# Patient Record
Sex: Male | Born: 1935 | Race: White | Hispanic: No | Marital: Married | Smoking: Never smoker
Health system: Southern US, Community
[De-identification: ages and names within clinical notes are randomized; demographics above are authoritative.]

## PROBLEM LIST (undated history)

## (undated) DIAGNOSIS — L409 Psoriasis, unspecified: Secondary | ICD-10-CM

## (undated) DIAGNOSIS — C61 Malignant neoplasm of prostate: Secondary | ICD-10-CM

## (undated) DIAGNOSIS — I10 Essential (primary) hypertension: Secondary | ICD-10-CM

## (undated) DIAGNOSIS — I4891 Unspecified atrial fibrillation: Secondary | ICD-10-CM

## (undated) DIAGNOSIS — I499 Cardiac arrhythmia, unspecified: Secondary | ICD-10-CM

## (undated) HISTORY — PX: HAND SURGERY: SHX662

---

## 1997-06-29 ENCOUNTER — Ambulatory Visit (HOSPITAL_COMMUNITY): Admission: RE | Admit: 1997-06-29 | Discharge: 1997-06-29 | Payer: Self-pay | Admitting: Cardiology

## 1997-06-29 ENCOUNTER — Other Ambulatory Visit: Admission: RE | Admit: 1997-06-29 | Discharge: 1997-06-29 | Payer: Self-pay | Admitting: Cardiology

## 2002-10-07 ENCOUNTER — Ambulatory Visit (HOSPITAL_COMMUNITY): Admission: RE | Admit: 2002-10-07 | Discharge: 2002-10-07 | Payer: Self-pay | Admitting: Cardiology

## 2002-10-07 ENCOUNTER — Encounter: Payer: Self-pay | Admitting: Cardiology

## 2007-08-31 ENCOUNTER — Ambulatory Visit (HOSPITAL_COMMUNITY): Admission: RE | Admit: 2007-08-31 | Discharge: 2007-08-31 | Payer: Self-pay | Admitting: Orthopedic Surgery

## 2007-09-21 ENCOUNTER — Ambulatory Visit (HOSPITAL_BASED_OUTPATIENT_CLINIC_OR_DEPARTMENT_OTHER): Admission: RE | Admit: 2007-09-21 | Discharge: 2007-09-21 | Payer: Self-pay | Admitting: Orthopedic Surgery

## 2007-09-21 ENCOUNTER — Encounter (INDEPENDENT_AMBULATORY_CARE_PROVIDER_SITE_OTHER): Payer: Self-pay | Admitting: Orthopedic Surgery

## 2007-12-15 ENCOUNTER — Ambulatory Visit (HOSPITAL_COMMUNITY): Admission: RE | Admit: 2007-12-15 | Discharge: 2007-12-15 | Payer: Self-pay | Admitting: Urology

## 2009-08-30 ENCOUNTER — Ambulatory Visit (HOSPITAL_COMMUNITY): Admission: RE | Admit: 2009-08-30 | Discharge: 2009-08-30 | Payer: Self-pay | Admitting: Urology

## 2010-02-03 ENCOUNTER — Encounter: Payer: Self-pay | Admitting: Urology

## 2010-05-28 NOTE — Op Note (Signed)
NAME:  Stephen Molina, Stephen Molina NO.:  [id number]   MEDICAL RECORD NO.:  [id number]          PATIENT TYPE:  AMB   LOCATION:  DSC                          FACILITY:  [facility]   PHYSICIAN:  Katy Fitch. Sypher, M.D. DATE OF BIRTH:  1916/03/29   DATE OF PROCEDURE:  09/21/2007  DATE OF DISCHARGE:                               OPERATIVE REPORT   PREOPERATIVE DIAGNOSES:  Complex Dupuytren contracture, right hand with  extensive disease involving pre-tendinous fibers and palm to ring and  small fingers and extensive palmar fascial involvement of lateral  fascial sheaths, spiral cords, and central cords of ring finger leading  to a 75-degree flexion contracture of the proximal interphalangeal joint  and a stiff boutonniere posture due to contraction of the spiral oblique  retinacular ligaments, right ring finger.  Also, thumb index webspace  and natatory ligament contracture.   POSTOPERATIVE DIAGNOSES:  Complex Dupuytren contracture, right hand with  extensive disease involving pre-tendinous fibers and palm to ring and  small fingers and extensive palmar fascial involvement of lateral  fascial sheaths, spiral cords, and central cords of ring finger leading  to a 75-degree flexion contracture of the proximal interphalangeal joint  and a stiff boutonniere posture due to contraction of the spiral oblique  retinacular ligaments, right ring finger.  Also, thumb index webspace  and natatory ligament contracture.   OPERATION:  1. Resection of Dupuytren contracture of right small finger including      palm, pre-tendinous fibers, spiral cords, retrovascular cords,      lateral fascial sheaths, and distal central cord.  Also, release of      spiral oblique retinacular ligament, right ring finger and proximal      interphalangeal capsular-ligamentous release for relief of stiff      boutonniere posture.  2. Resection of pre-tendinous fibers, small finger and palm.  3. Needle aponeurotomy of  natatory contracture and thumb index      webspace.   OPERATING SURGEON:  Katy Fitch. Sypher, MD   ASSISTANT:  Marveen Reeks Dasnoit, PA-C   ANESTHESIA:  General sedation/right infraclavicular block.   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   In 2001, he consulted with me and was noted to have signs of significant  palmar fascial contracture.   He was advised to return when his PIP flexion contracture exceeded 30  degrees or his MP flexion contracture exceeded 45 degrees.   Dr. Smitty Cords returned with an extensive PIP contracture with a stiff  boutonniere posture and a 75-degree flexion fracture of the PIP joint.  He had extensive involvement of the palm.  He also had a contracture of  his thumb index webspace.   We had a lengthy informed consent preoperatively.   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  He was noted to have mild hypertension.   After detailed informed consent during which  we discussed the entire  spectrum of treatment options for Dupuytren contracture, he ultimately  elected to proceed with conventional fasciectomy.   Preoperatively, we advised him that we would release the first web cord  with either limited fasciectomy or a needle aponeurotomy technique and  also discussed in detail the process of resecting the palmar fascia  followed by an aftercare plan with nighttime splinting and compression  dressings.   Preoperatively, questions were invited and answered in detail.   PROCEDURE:  Stephen Molina was brought to the operating room and placed  in supine position on the operating table.  Following anesthesia consult  with Dr. Noreene Larsson in the holding area, a right infraclavicular block was  placed without complication.  Excellent  anesthesia of the right upper  extremity was accomplished.   Dr. Smitty Cords was brought to room 6 of the [facility], placed in  supine position on the operating table, and under Dr. Morley Kos direct  supervision sedation provided.   The right arm was prepped with Betadine soap solution and sterilely  draped.  Ancef 1 gram was administered as an IV prophylactic antibiotic.   Due to mild systolic hypertension, the tourniquet was set at 260 mmHg.  Following exsanguination of the right arm with an Esmarch bandage, the  tourniquet was inflated on the proximal brachium.   Procedure commenced with planning of Brunner zigzag incisions with an  anticipated V-Y advancement flap technique to lengthen the skin of the  ring finger.   The skin flaps were taken sharply and elevated with scalpel and scissors  dissection.  Extensive infiltration of the palmar skin was noted with  particular involvement of nodular disease at the middle palmar crease  and at the level of the PIP flexion crease.  That was extreme involving  the lateral fascial sheets of the fingers.  A large spiral band on the  ulnar aspect of the ring finger and extensive involvement with a  retrovascular cord on the radial aspect of the finger.  The distal  disease at the middle phalanx was resected.   After completion of the dissection, the neurovascular bundles were  intact.  We will leave the PIP flexion fracture to approximately 25  degrees, however, the DIP joint could only flex 20 degrees due to  contracture of the spiral oblique retinacular ligament.  This was  identified and resected.  With gentle manipulation, the PIP joint was  extended to less than 5 degrees flexion contracture with minimal release  of the capsular-ligamentous structures.   With the PIP joint in maximal extension, I was able to flex the DIP  joint 70 degrees.   We will address the attenuation of the extensor mechanism with  splinting.   The  pre-tendinous fibers to the small finger were then resected to the  same wound in the palm, followed by needle aponeurotomy of the thumb  index webspace with 3 needle passes relieving the clinical contracture.   The tourniquet was released and hemostasis was achieved under saline  irrigation with bipolar forceps and direct pressure.  The V-Y flaps were  advanced and inset with multiple interrupted sutures of 5-0 nylon.   A voluminous dressings applied with Silvadene, Adaptic, sterile gauze,  sterile Kerlix, sterile Webril, and a volar plaster splint system.  There were no apparent complications.   Dr. Smitty Cords had immediate capillary refill upon release of the tourniquet  in all fingers and the thumb.      Katy Fitch Sypher, M.D.  Electronically Signed     RVS/MEDQ  D:  09/21/2007  T:  09/22/2007  Job:  [id number]

## 2014-12-03 ENCOUNTER — Encounter (HOSPITAL_COMMUNITY): Payer: Self-pay | Admitting: Emergency Medicine

## 2014-12-03 ENCOUNTER — Emergency Department (HOSPITAL_COMMUNITY): Payer: Medicare Other

## 2014-12-03 DIAGNOSIS — R000 Tachycardia, unspecified: Secondary | ICD-10-CM | POA: Diagnosis not present

## 2014-12-03 DIAGNOSIS — R402142 Coma scale, eyes open, spontaneous, at arrival to emergency department: Secondary | ICD-10-CM | POA: Diagnosis present

## 2014-12-03 DIAGNOSIS — I5021 Acute systolic (congestive) heart failure: Secondary | ICD-10-CM

## 2014-12-03 DIAGNOSIS — R402362 Coma scale, best motor response, obeys commands, at arrival to emergency department: Secondary | ICD-10-CM | POA: Diagnosis present

## 2014-12-03 DIAGNOSIS — I214 Non-ST elevation (NSTEMI) myocardial infarction: Secondary | ICD-10-CM | POA: Diagnosis present

## 2014-12-03 DIAGNOSIS — Z923 Personal history of irradiation: Secondary | ICD-10-CM

## 2014-12-03 DIAGNOSIS — Z515 Encounter for palliative care: Secondary | ICD-10-CM | POA: Diagnosis present

## 2014-12-03 DIAGNOSIS — I480 Paroxysmal atrial fibrillation: Secondary | ICD-10-CM | POA: Diagnosis present

## 2014-12-03 DIAGNOSIS — I4510 Unspecified right bundle-branch block: Secondary | ICD-10-CM | POA: Diagnosis present

## 2014-12-03 DIAGNOSIS — R402252 Coma scale, best verbal response, oriented, at arrival to emergency department: Secondary | ICD-10-CM | POA: Diagnosis present

## 2014-12-03 DIAGNOSIS — R0902 Hypoxemia: Secondary | ICD-10-CM | POA: Diagnosis present

## 2014-12-03 DIAGNOSIS — R451 Restlessness and agitation: Secondary | ICD-10-CM | POA: Diagnosis not present

## 2014-12-03 DIAGNOSIS — N179 Acute kidney failure, unspecified: Secondary | ICD-10-CM | POA: Diagnosis not present

## 2014-12-03 DIAGNOSIS — Z7982 Long term (current) use of aspirin: Secondary | ICD-10-CM

## 2014-12-03 DIAGNOSIS — R0600 Dyspnea, unspecified: Secondary | ICD-10-CM

## 2014-12-03 DIAGNOSIS — Z79899 Other long term (current) drug therapy: Secondary | ICD-10-CM

## 2014-12-03 DIAGNOSIS — I509 Heart failure, unspecified: Secondary | ICD-10-CM

## 2014-12-03 DIAGNOSIS — I10 Essential (primary) hypertension: Secondary | ICD-10-CM | POA: Diagnosis present

## 2014-12-03 DIAGNOSIS — I4891 Unspecified atrial fibrillation: Secondary | ICD-10-CM | POA: Diagnosis present

## 2014-12-03 DIAGNOSIS — Z8546 Personal history of malignant neoplasm of prostate: Secondary | ICD-10-CM

## 2014-12-03 DIAGNOSIS — Z66 Do not resuscitate: Secondary | ICD-10-CM | POA: Diagnosis present

## 2014-12-03 DIAGNOSIS — I255 Ischemic cardiomyopathy: Secondary | ICD-10-CM | POA: Diagnosis not present

## 2014-12-03 HISTORY — DX: Malignant neoplasm of prostate: C61

## 2014-12-03 HISTORY — DX: Cardiac arrhythmia, unspecified: I49.9

## 2014-12-03 HISTORY — DX: Psoriasis, unspecified: L40.9

## 2014-12-03 HISTORY — DX: Essential (primary) hypertension: I10

## 2014-12-03 HISTORY — DX: Unspecified atrial fibrillation: I48.91

## 2014-12-03 LAB — TROPONIN I: Troponin I: >65 ng/mL (ref <0.031)

## 2014-12-03 LAB — COMPREHENSIVE METABOLIC PANEL
ALBUMIN: 3.3 g/dL — AB (ref 3.5–5.0)
ALK PHOS: 81 U/L (ref 38–126)
ALT: 77 U/L — ABNORMAL HIGH (ref 17–63)
ANION GAP: 20 — AB (ref 5–15)
AST: 475 U/L — ABNORMAL HIGH (ref 15–41)
BILIRUBIN TOTAL: 0.9 mg/dL (ref 0.3–1.2)
BUN: 37 mg/dL — ABNORMAL HIGH (ref 6–20)
CALCIUM: 9.2 mg/dL (ref 8.9–10.3)
CO2: 18 mmol/L — AB (ref 22–32)
Chloride: 98 mmol/L — ABNORMAL LOW (ref 101–111)
Creatinine, Ser: 2.14 mg/dL — ABNORMAL HIGH (ref 0.61–1.24)
GFR calc non Af Amer: 24 mL/min — ABNORMAL LOW (ref >60)
GFR, EST AFRICAN AMERICAN: 28 mL/min — AB (ref >60)
GLUCOSE: 232 mg/dL — AB (ref 65–99)
POTASSIUM: 4.1 mmol/L (ref 3.5–5.1)
SODIUM: 136 mmol/L (ref 135–145)
TOTAL PROTEIN: 7 g/dL (ref 6.5–8.1)

## 2014-12-03 LAB — CBC WITH DIFFERENTIAL/PLATELET
BASOS ABS: 0 10*3/uL (ref 0.0–0.1)
BASOS PCT: 0 %
Eosinophils Absolute: 0 10*3/uL (ref 0.0–0.7)
Eosinophils Relative: 0 %
HEMATOCRIT: 53.5 % — AB (ref 39.0–52.0)
HEMOGLOBIN: 17.2 g/dL — AB (ref 13.0–17.0)
LYMPHS PCT: 7 %
Lymphs Abs: 1.1 10*3/uL (ref 0.7–4.0)
MCH: 31.3 pg (ref 26.0–34.0)
MCHC: 32.1 g/dL (ref 30.0–36.0)
MCV: 97.3 fL (ref 78.0–100.0)
Monocytes Absolute: 0.5 10*3/uL (ref 0.1–1.0)
Monocytes Relative: 3 %
NEUTROS ABS: 14.7 10*3/uL — AB (ref 1.7–7.7)
NEUTROS PCT: 90 %
Platelets: 219 10*3/uL (ref 150–400)
RBC: 5.5 MIL/uL (ref 4.22–5.81)
RDW: 14.6 % (ref 11.5–15.5)
WBC: 16.3 10*3/uL — AB (ref 4.0–10.5)

## 2014-12-03 LAB — MAGNESIUM: Magnesium: 2.4 mg/dL (ref 1.7–2.4)

## 2014-12-03 LAB — MRSA PCR SCREENING: MRSA by PCR: NEGATIVE

## 2014-12-03 LAB — PROTIME-INR
INR: 1.18 (ref 0.00–1.49)
Prothrombin Time: 15.2 seconds (ref 11.6–15.2)

## 2014-12-03 LAB — I-STAT ARTERIAL BLOOD GAS, ED
Acid-base deficit: 7 mmol/L — ABNORMAL HIGH (ref 0.0–2.0)
BICARBONATE: 15.4 meq/L — AB (ref 20.0–24.0)
O2 Saturation: 87 %
PCO2 ART: 25.1 mmHg — AB (ref 35.0–45.0)
PO2 ART: 49 mmHg — AB (ref 80.0–100.0)
TCO2: 16 mmol/L (ref 0–100)
pH, Arterial: 7.393 (ref 7.350–7.450)

## 2014-12-03 LAB — TSH: TSH: 1.965 u[IU]/mL (ref 0.350–4.500)

## 2014-12-03 LAB — HEPARIN LEVEL (UNFRACTIONATED): Heparin Unfractionated: 0.86 IU/mL — ABNORMAL HIGH (ref 0.30–0.70)

## 2014-12-03 LAB — BRAIN NATRIURETIC PEPTIDE: B Natriuretic Peptide: 4310.4 pg/mL — ABNORMAL HIGH (ref 0.0–100.0)

## 2014-12-03 MED ORDER — ALBUTEROL SULFATE (2.5 MG/3ML) 0.083% IN NEBU
5.0000 mg | INHALATION_SOLUTION | Freq: Once | RESPIRATORY_TRACT | Status: AC
  Administered 2014-12-03: 5 mg via RESPIRATORY_TRACT
  Filled 2014-12-03: qty 6

## 2014-12-03 MED ORDER — ONDANSETRON HCL 4 MG/2ML IJ SOLN: 4.0000 mg | Freq: Four times a day (QID) | INTRAMUSCULAR | Status: DC | PRN

## 2014-12-03 MED ORDER — SODIUM CHLORIDE 0.9 % IJ SOLN: 3.0000 mL | INTRAMUSCULAR | Status: DC | PRN

## 2014-12-03 MED ORDER — ZOLPIDEM TARTRATE 5 MG PO TABS: 5.0000 mg | ORAL_TABLET | Freq: Every evening | ORAL | Status: DC | PRN

## 2014-12-03 MED ORDER — HEPARIN (PORCINE) IN NACL 100-0.45 UNIT/ML-% IJ SOLN
800.0000 [IU]/h | INTRAMUSCULAR | Status: DC
  Administered 2014-12-03: 1000 [IU]/h via INTRAVENOUS
  Administered 2014-12-04: 850 [IU]/h via INTRAVENOUS
  Filled 2014-12-03 (×3): qty 250

## 2014-12-03 MED ORDER — ASPIRIN 81 MG PO CHEW: 324.0000 mg | CHEWABLE_TABLET | Freq: Once | ORAL | Status: DC

## 2014-12-03 MED ORDER — FUROSEMIDE 10 MG/ML IJ SOLN
40.0000 mg | Freq: Once | INTRAMUSCULAR | Status: AC
  Administered 2014-12-03: 80 mg via INTRAVENOUS
  Filled 2014-12-03: qty 4

## 2014-12-03 MED ORDER — ATORVASTATIN CALCIUM 80 MG PO TABS
80.0000 mg | ORAL_TABLET | Freq: Every day | ORAL | Status: DC
  Administered 2014-12-03: 80 mg via ORAL
  Filled 2014-12-03: qty 1

## 2014-12-03 MED ORDER — CETYLPYRIDINIUM CHLORIDE 0.05 % MT LIQD
7.0000 mL | Freq: Two times a day (BID) | OROMUCOSAL | Status: DC
  Administered 2014-12-03 – 2014-12-05 (×4): 7 mL via OROMUCOSAL

## 2014-12-03 MED ORDER — ASPIRIN EC 81 MG PO TBEC
81.0000 mg | DELAYED_RELEASE_TABLET | Freq: Every day | ORAL | Status: DC
  Filled 2014-12-03: qty 1

## 2014-12-03 MED ORDER — HALOPERIDOL LACTATE 5 MG/ML IJ SOLN
2.0000 mg | Freq: Once | INTRAMUSCULAR | Status: AC
  Administered 2014-12-03: 2 mg via INTRAVENOUS
  Filled 2014-12-03: qty 1

## 2014-12-03 MED ORDER — FUROSEMIDE 10 MG/ML IJ SOLN
80.0000 mg | Freq: Once | INTRAMUSCULAR | Status: AC
  Administered 2014-12-03: 80 mg via INTRAVENOUS
  Filled 2014-12-03: qty 8

## 2014-12-03 MED ORDER — SODIUM CHLORIDE 0.9 % IJ SOLN
3.0000 mL | Freq: Two times a day (BID) | INTRAMUSCULAR | Status: DC
  Administered 2014-12-03 – 2014-12-05 (×4): 3 mL via INTRAVENOUS

## 2014-12-03 MED ORDER — ASPIRIN 81 MG PO CHEW
324.0000 mg | CHEWABLE_TABLET | Freq: Once | ORAL | Status: AC
  Administered 2014-12-03: 324 mg via ORAL
  Filled 2014-12-03: qty 4

## 2014-12-03 MED ORDER — FUROSEMIDE 10 MG/ML IJ SOLN
40.0000 mg | Freq: Once | INTRAMUSCULAR | Status: AC
  Administered 2014-12-03: 40 mg via INTRAVENOUS
  Filled 2014-12-03: qty 4

## 2014-12-03 MED ORDER — HEPARIN BOLUS VIA INFUSION
4000.0000 [IU] | Freq: Once | INTRAVENOUS | Status: AC
  Administered 2014-12-03: 4000 [IU] via INTRAVENOUS
  Filled 2014-12-03: qty 4000

## 2014-12-03 MED ORDER — PANTOPRAZOLE SODIUM 40 MG PO TBEC
40.0000 mg | DELAYED_RELEASE_TABLET | Freq: Every day | ORAL | Status: DC
Start: 2014-12-03 — End: 2014-12-05
  Administered 2014-12-03: 40 mg via ORAL
  Filled 2014-12-03 (×2): qty 1

## 2014-12-03 MED ORDER — SODIUM CHLORIDE 0.9 % IV SOLN: 250.0000 mL | INTRAVENOUS | Status: DC | PRN

## 2014-12-03 MED ORDER — MILRINONE IN DEXTROSE 20 MG/100ML IV SOLN
0.1250 ug/kg/min | INTRAVENOUS | Status: DC
  Administered 2014-12-03 – 2014-12-04 (×2): 0.125 ug/kg/min via INTRAVENOUS
  Filled 2014-12-03 (×2): qty 100

## 2014-12-03 MED ORDER — FUROSEMIDE 10 MG/ML IJ SOLN
INTRAMUSCULAR | Status: AC
  Filled 2014-12-03: qty 4

## 2014-12-03 MED ORDER — FUROSEMIDE 10 MG/ML IJ SOLN
40.0000 mg | Freq: Two times a day (BID) | INTRAMUSCULAR | Status: DC
  Administered 2014-12-03 – 2014-12-06 (×6): 40 mg via INTRAVENOUS
  Filled 2014-12-03 (×7): qty 4

## 2014-12-03 MED ORDER — NITROGLYCERIN 0.4 MG SL SUBL: 0.4000 mg | SUBLINGUAL_TABLET | SUBLINGUAL | Status: DC | PRN

## 2014-12-03 MED ORDER — CARVEDILOL 3.125 MG PO TABS: 3.1250 mg | ORAL_TABLET | Freq: Two times a day (BID) | ORAL | Status: DC

## 2014-12-03 MED ORDER — ACETAMINOPHEN 325 MG PO TABS: 650.0000 mg | ORAL_TABLET | ORAL | Status: DC | PRN

## 2014-12-03 MED ORDER — ALPRAZOLAM 0.25 MG PO TABS
0.2500 mg | ORAL_TABLET | Freq: Two times a day (BID) | ORAL | Status: DC | PRN
  Administered 2014-12-03 – 2014-12-04 (×2): 0.25 mg via ORAL
  Filled 2014-12-03 (×2): qty 1

## 2014-12-03 MED ORDER — CLOPIDOGREL BISULFATE 75 MG PO TABS
75.0000 mg | ORAL_TABLET | Freq: Every day | ORAL | Status: DC
  Administered 2014-12-03: 75 mg via ORAL
  Filled 2014-12-03 (×2): qty 1

## 2014-12-03 NOTE — Progress Notes (Signed)
Utilization Review Completed.Stephen Molina T11/20/2016  

## 2014-12-03 NOTE — H&P (Addendum)
History and Physical   Patient ID: Stephen Molina MRN: [id number], DOB/AGE: 1916/02/24 81 y.o. Date of Encounter: 11/20/2014  Primary Physician: No primary care provider on file. Primary Cardiologist: New  Chief Complaint:  SOB  HPI: Stephen Molina is a 81 y.o. male with a history of mild HTN, no hx DM, HL, CAD.   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 He does not know any particular heart rates. Can tell he is in atrial fib now by pulse check. No clear how long he has been in afib this time.   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 He notes that rest breaks have been more frequent and he is going slower. Denies LE edema, PND. Orthopnea is not clear.   Last pm, he had acutely worse SOB, some cough, non-productive. As the SOB became worse his wife brought him to the ER. In the ER, he got a nebulizer and is on NRB. Respirations are still labored but his O2 sats have improved.  Never had SOB like this before. Never gets chest pain. No edema. Takes Lasix 20 mg qd for HTN.   Past Medical History  Diagnosis Date  . Prostate cancer ([place])     1980s, s/p seed implants  . Essential hypertension     mild  . Atrial fibrillation ([place])     off and on since 2014 or so  . Psoriasis     Surgical History:  Past Surgical History  Procedure Laterality Date  . Hand surgery       I have reviewed the patient's current medications. Prior to Admission medications   Medication Sig Start Date End Date Taking? Authorizing Provider  aspirin 81 MG tablet Take 81 mg by mouth daily.   Yes Historical Provider, MD  furosemide (LASIX) 20 MG tablet Take 20 mg by mouth daily.   Yes Historical Provider, MD   Scheduled Meds:   Allergies: No Known Allergies  Social History   Social History  . Marital Status: Married    Spouse Name: N/A  . Number of Children: N/A  . Years of Education: N/A    Occupational History  . Retired Film/video editor    Social History Main Topics  . Smoking status: Never Smoker   . Smokeless tobacco: Not on file  . Alcohol Use: No  . Drug Use: No  . Sexual Activity: Not on file   Other Topics Concern  . Not on file   Social History Narrative   Lives in [place] with wife.    No family history on file. Family Status  Relation Status Death Age  . Mother Deceased 7    No CAD  . Father Deceased     Murdered, no hx CAD    Review of Systems:   Full 14-point review of systems otherwise negative except as noted above.  Physical Exam: Blood pressure 112/77, pulse 100, temperature 97.1 F (36.2 C), temperature source Temporal, resp. rate 35, SpO2 94 %. General: Well developed, well nourished,male in no acute distress. Head: Normocephalic, atraumatic, sclera non-icteric, no xanthomas, nares are without discharge. Dentition:  Neck: No carotid bruits. JVD elevated 10 cm. No thyromegally Lungs: Good expansion bilaterally. without wheezes, + rhonchi and rales  Heart: IRRegular rate and rhythm with S1 S2.  No S3 or S4.  No murmur, no rubs, or gallops  appreciated. Heart sounds difficult to hear because of respiratory noise Abdomen: Soft, non-tender, non-distended with normoactive bowel sounds. No hepatomegaly. No rebound/guarding. No obvious abdominal masses. Msk:  Strength and tone appear normal for age. No joint deformities or effusions, no spine or costo-vertebral angle tenderness. Extremities: No clubbing or cyanosis. No edema.  Distal pedal pulses are 2+ in 4 extrem Neuro: Alert and oriented X 3. Moves all extremities spontaneously. No focal deficits noted. Psych:  Responds to questions appropriately with a normal affect. Skin: No rashes or lesions noted  Labs: ABG    Component Value Date/Time   PHART 7.393 11/19/2014 0958   PCO2ART 25.1* 12/02/2014 0958   PO2ART 49.0* 11/15/2014 0958   HCO3 15.4* 12/04/2014 0958   TCO2 16  12/12/2014 0958   ACIDBASEDEF 7.0* 11/30/2014 0958   O2SAT 87.0 12/07/2014 0958   Lab Results  Component Value Date   WBC 16.3* 11/23/2014   HGB 17.2* 11/19/2014   HCT 53.5* 11/16/2014   MCV 97.3 11/21/2014   PLT 219 11/20/2014   No results for input(s): INR in the last 72 hours. No results for input(s): NA, K, CL, CO2, BUN, CREATININE, CALCIUM, PROT, BILITOT, ALKPHOS, ALT, AST, GLUCOSE in the last 168 hours.  Invalid input(s): LABALBU No results for input(s): CKTOTAL, CKMB, TROPONINI in the last 72 hours. No results for input(s): TROPIPOC in the last 72 hours. No results found for: CHOL, HDL, LDLCALC, TRIG No results found for: BNP No results found for: PROBNP No results found for: DDIMER  Radiology/Studies: Dg Chest 2 View 11/30/2014  CLINICAL DATA:  Shortness of breath. New onset of atrial fibrillation. EXAM: CHEST  2 VIEW COMPARISON:  08/31/2007 FINDINGS: There is new cardiomegaly with diffuse bilateral pulmonary edema as well as small bilateral pleural effusions. Tortuous calcified thoracic aorta. Calcified granulomas in the left hilum. IMPRESSION: Cardiomegaly with bilateral pulmonary edema and small bilateral effusions. Electronically Signed   By: Lorriane Shire M.D.   On: 12/11/2014 09:50   ECG: Machine read as ST, rate 103, believe it is actually afib.  ASSESSMENT AND PLAN:  Principal Problem:   Acute CHF (congestive heart failure) (HCC) - diurese, follow I/O, renal function, weights - ck echo - may need ischemic eval  Active Problems:   Essential hypertension - good control now    Atrial fibrillation, rapid ([place]) - duration unknown - CHADS2VASC at least 3 - discussed anticoagulation options, MD to review w/ patient - add heparin if not put on oral med  Plan: admit stepdown  Signed, Rosaria Ferries, PA-C 12/09/2014 10:42 AM Beeper [phone]  As above, patient seen and examined. Briefly he is a 81 year old male with past medical history of hypertension,  prostate cancer status post radiation with new onset congestive heart failure. Patient is a difficult historian. He apparently ambulates routinely without dyspnea, chest pain, palpitations or syncope. There is a question of history of atrial fibrillation based on patient checking pulse and feeling that it was irregular but no documentation. Patient apparently developed increasing dyspnea last night. No chest pain. No fevers, chills or productive cough. No palpitations. Chest x-ray shows pulmonary edema and small bilateral pleural effusions. BUN 37 and creatinine 2.14. No previous for comparison. SGOT 475. BNP 4310. White blood cell count 16.3. Electrocardiogram shows sinus rhythm with PACs, right bundle branch block, left posterior fascicular block.  Patient presents with new onset congestive heart failure. Admit to stepdown. Schedule echocardiogram to assess LV function. Diurese with Lasix 40 mg IV twice a day and follow renal  function closely. Note his renal function is abnormal and I have no baseline laboratories. Cycle enzymes. SGOT may be elevated from passive congestion but must also consider recent infarct. Await initial CK-MB and troponin. 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 Note there was a question of atrial fibrillation. I believe his electrocardiogram shows sinus with PACs although significant artifact. We will follow on telemetry closely for any evidence of atrial fibrillation which could certainly be contributing to heart failure. Kirk Ruths   Patient's laboratories now all back. Troponin is greater than 65. It is apparent patient's new onset congestive heart failure is related to recent infarct. He denies any chest pain in the last 48 hours. We are likely greater than 24 hours into his event as his troponin and SGOT are elevated. We will treat with aspirin, Plavix, heparin (48-72 hours), statin and low-dose beta-blockade. Check echocardiogram for LV  function as described above. Diurese with Lasix 40 mg IV twice a day and follow renal function. Long discussion with patient and wife concerning findings. He is a no CODE BLUE and both he and his wife confirm this. There will be no intubation, CPR or defibrillation. I discussed aggressiveness of care with the patient and his wife. His wife feels that his mental status is declining. He is 81 years old and has very poor renal function placing him at high risk for contrast nephropathy. They both agree that he wants only conservative measures. We will plan medical therapy only with no cardiac catheterization. Prognosis guarded. Kirk Ruths

## 2014-12-03 NOTE — Progress Notes (Signed)
Called to see pt re: hypoxia and agitation  Pt tx from ER to 2H18. After wife left, O2 sats were in the 80s, pt agitated and pulling at O2 mask and IVs.   Additional Lasix ordered by MD was given.  Spoke with pt, sat him up very straight and changed site of O2 sensor. O2 sats improved to low 90s, waveform still not great.   Pt responded well to verbal and was reoriented. However, continued to be restless and pull at lines, mask. Mitts applied.   Bladder scan performed because pt had no urine output reported after 40 mg IV Lasix in ER. This showed no urine.   Attempted to update wife, no answer and no other contacts in the computer.   Pt becoming a little calmer, will order a sitter, recheck bladder scan in 1 hour and discuss with MD if still no UOP.   Lenoard Aden 11/15/2014 1:22 PM Beeper [id number]   Attending Note:   The patient was seen and examined.  Agree with assessment and plan as noted above.  Changes made to the above note as needed.  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 On FM O2.  May need BIPAP - especially when he is sleeping.  He did not diurese at all after Laisx 40 .  Will give 67 and see if that works.    Thayer Headings, Brooke Bonito., MD, [facility] 12/05/2014, 1:38 PM [address]  [place] Pager [phone]

## 2014-12-03 NOTE — ED Notes (Signed)
Pt arrives [facility] for c/o sob that began yesterday. EMS also reports patient presented in A fib, no hx of afib or any breathing disorders. Pts O2 sat 70% on RA per ems. Pt placed on NRB 15L, O2 sat 96%.

## 2014-12-03 NOTE — Progress Notes (Signed)
Md called pt very anxious and pulling at things.  Felt full and distended. Bladder scan = 0.   New orders received.   Will continue to monitor .  Family at bedside.  Saunders Revel T

## 2014-12-03 NOTE — Progress Notes (Signed)
ANTICOAGULATION CONSULT NOTE Pharmacy Consult for heparin Indication: chest pain/ACS  No Known Allergies  Patient Measurements: Height: 5\' 8"  (172.7 cm) Weight: 134 lb 6.4 oz (60.963 kg) IBW/kg (Calculated) : 68.4 Heparin Dosing Weight: 77kg  Vital Signs: Temp: 97.6 F (36.4 C) (11/20 1946) Temp Source: Oral (11/20 1946) BP: 153/129 mmHg (11/20 2000) Pulse Rate: 115 (11/20 2100)  Labs:  Recent Labs  11/14/2014 1007 12/07/2014 1300 12/12/2014 1824 12/05/2014 2150  HGB 17.2*  --   --   --   HCT 53.5*  --   --   --   PLT 219  --   --   --   LABPROT  --  15.2  --   --   INR  --  1.18  --   --   HEPARINUNFRC  --   --   --  0.86*  CREATININE 2.14*  --   --   --   TROPONINI >65.00* >65.00* >65.00*  --     Estimated Creatinine Clearance: 17.4 mL/min (by C-G formula based on Cr of 2.14).    Assessment: 76 yof presented to the ED with SOB. Known history of afib but not on any anticoagulation. Troponin found to be >65. To start IV heparin. Baseline H/H elevated and platelets are WNL.  Evening heparin level = 0.86  Goal of Therapy:  Heparin level 0.3-0.7 units/ml Monitor platelets by anticoagulation protocol: Yes   Plan:  - Decrease heparin to 850 units / hr - Check an 8 hour heparin level - Daily heparin level and CBC  Thank you Anette Guarneri, PharmD [phone]  Tad Moore 12/02/2014,10:36 PM

## 2014-12-03 NOTE — ED Notes (Signed)
Dr. Stanford Breed made aware of patient's troponin of >65

## 2014-12-03 NOTE — ED Provider Notes (Signed)
CSN: [id number]     Arrival date & time 12/01/2014  [id number] History   First MD Initiated Contact with Patient 12/02/2014 [phone]     Chief Complaint  Patient presents with  . Shortness of Breath     (Consider location/radiation/quality/duration/timing/severity/associated sxs/prior Treatment) HPI Patient has had increasing shortness of breath for several days. He notes that he has had paroxysmal atrial fibrillation. This is based objective on his own monitoring of his pulse rate. Dr. Darnell Level is a prior cardiologist. He normally walks without difficulty. As of the past day he's become significant only short of breath. He denies chest pain. No significant amount coughing. No known fever. The patient is a difficult historian with some difficulty with recall. The patient's wife is present. She is a good historian reports that he has not reported any chest pain or difficulty to her. She reports however he would be unlikely to say anything. Past Medical History  Diagnosis Date  . Prostate cancer ([place])     1980s, s/p seed implants  . Essential hypertension     mild  . Atrial fibrillation ([place])     off and on since 2014 or so  . Psoriasis   . Dysrhythmia    Past Surgical History  Procedure Laterality Date  . Hand surgery     History reviewed. No pertinent family history. Social History  Substance Use Topics  . Smoking status: Never Smoker   . Smokeless tobacco: None  . Alcohol Use: No    Review of Systems  10 Systems reviewed and are negative for acute change except as noted in the HPI.   Allergies  Review of patient's allergies indicates no known allergies.  Home Medications   Prior to Admission medications   Medication Sig Start Date End Date Taking? Authorizing Provider  aspirin 81 MG tablet Take 81 mg by mouth daily.   Yes Historical Provider, MD  furosemide (LASIX) 20 MG tablet Take 20 mg by mouth daily.   Yes Historical Provider, MD   BP 78/48 mmHg  Pulse 45  Temp(Src) 98 F  (36.7 C) (Axillary)  Resp 10  Ht 5\' 8"  (1.727 m)  Wt 135 lb 5.8 oz (61.4 kg)  BMI 20.59 kg/m2  SpO2 82% Physical Exam  Constitutional: He appears well-developed and well-nourished.  For patient's age she is well-nourished well-developed. He is moderately dyspneic. Patient is awake and alert.  HENT:  Head: Normocephalic and atraumatic.  Eyes: EOM are normal. Pupils are equal, round, and reactive to light.  Neck: Neck supple.  Cardiovascular:  Tachycardia. Irregularly irregular pulse.  Pulmonary/Chest:  Moderate increased work of breathing. Diffuse rails.  Abdominal: Soft. Bowel sounds are normal. He exhibits no distension. There is no tenderness.  Musculoskeletal: Normal range of motion. He exhibits no edema.  Neurological: He is alert. He has normal strength. Coordination normal. GCS eye subscore is 4. GCS verbal subscore is 5. GCS motor subscore is 6.  Skin: Skin is warm, dry and intact.    ED Course  Procedures (including critical care time) CRITICAL CARE Performed by: Charlesetta Shanks   Total critical care time: 30 minutes  Critical care time was exclusive of separately billable procedures and treating other patients.  Critical care was necessary to treat or prevent imminent or life-threatening deterioration.  Critical care was time spent personally by me on the following activities: development of treatment plan with patient and/or surrogate as well as nursing, discussions with consultants, evaluation of patient's response to treatment, examination of patient, obtaining  history from patient or surrogate, ordering and performing treatments and interventions, ordering and review of laboratory studies, ordering and review of radiographic studies, pulse oximetry and re-evaluation of patient's condition. Labs Review Labs Reviewed  COMPREHENSIVE METABOLIC PANEL - Abnormal; Notable for the following:    Chloride 98 (*)    CO2 18 (*)    Glucose, Bld 232 (*)    BUN 37 (*)     Creatinine, Ser 2.14 (*)    Albumin 3.3 (*)    AST 475 (*)    ALT 77 (*)    GFR calc non Af Amer 24 (*)    GFR calc Af Amer 28 (*)    Anion gap 20 (*)    All other components within normal limits  BRAIN NATRIURETIC PEPTIDE - Abnormal; Notable for the following:    B Natriuretic Peptide 4310.4 (*)    All other components within normal limits  TROPONIN I - Abnormal; Notable for the following:    Troponin I >65.00 (*)    All other components within normal limits  CBC WITH DIFFERENTIAL/PLATELET - Abnormal; Notable for the following:    WBC 16.3 (*)    Hemoglobin 17.2 (*)    HCT 53.5 (*)    Neutro Abs 14.7 (*)    All other components within normal limits  HEPARIN LEVEL (UNFRACTIONATED) - Abnormal; Notable for the following:    Heparin Unfractionated 0.86 (*)    All other components within normal limits  TROPONIN I - Abnormal; Notable for the following:    Troponin I >65.00 (*)    All other components within normal limits  TROPONIN I - Abnormal; Notable for the following:    Troponin I >65.00 (*)    All other components within normal limits  TROPONIN I - Abnormal; Notable for the following:    Troponin I >80.00 (*)    All other components within normal limits  HEMOGLOBIN A1C - Abnormal; Notable for the following:    Hgb A1c MFr Bld 5.7 (*)    All other components within normal limits  BASIC METABOLIC PANEL - Abnormal; Notable for the following:    Glucose, Bld 145 (*)    BUN 50 (*)    Creatinine, Ser 2.43 (*)    GFR calc non Af Amer 21 (*)    GFR calc Af Amer 24 (*)    All other components within normal limits  CBC - Abnormal; Notable for the following:    WBC 17.3 (*)    All other components within normal limits  LIPID PANEL - Abnormal; Notable for the following:    Cholesterol 236 (*)    LDL Cholesterol 165 (*)    All other components within normal limits  HEPARIN LEVEL (UNFRACTIONATED) - Abnormal; Notable for the following:    Heparin Unfractionated 0.91 (*)    All  other components within normal limits  BASIC METABOLIC PANEL - Abnormal; Notable for the following:    Chloride 99 (*)    Glucose, Bld 133 (*)    BUN 69 (*)    Creatinine, Ser 2.95 (*)    Calcium 8.4 (*)    GFR calc non Af Amer 17 (*)    GFR calc Af Amer 19 (*)    All other components within normal limits  CBC - Abnormal; Notable for the following:    WBC 13.2 (*)    All other components within normal limits  HEPARIN LEVEL (UNFRACTIONATED) - Abnormal; Notable for the following:    Heparin  Unfractionated 0.20 (*)    All other components within normal limits  I-STAT ARTERIAL BLOOD GAS, ED - Abnormal; Notable for the following:    pCO2 arterial 25.1 (*)    pO2, Arterial 49.0 (*)    Bicarbonate 15.4 (*)    Acid-base deficit 7.0 (*)    All other components within normal limits  MRSA PCR SCREENING  MAGNESIUM  TSH  PROTIME-INR  HEPARIN LEVEL (UNFRACTIONATED)    Imaging Review No results found. I have personally reviewed and evaluated these images and lab results as part of my medical decision-making.   EKG Interpretation   Date/Time:  Sunday December 03 2014 08:39:03 EST Ventricular Rate:  103 PR Interval:  136 QRS Duration: 128 QT Interval:  378 QTC Calculation: 495 R Axis:   128 Text Interpretation:  Sinus tachycardia with irregular rate RBBB and LPFB  Anterolateral infarct, age indeterminate Minimal ST elevation, inferior  leads agree Confirmed by Johnney Killian, MD, Jeannie Done [phone]) on 11/24/2014 9:44:16  AM      Consult: 09:55. Reviewed with Dr. Stanford Breed, will evaluate in the emergency department. At this time does not feel that EKG is likely to be inferior MI. MDM   Final diagnoses:  Atrial fibrillation, unspecified type [facility])  Essential hypertension  Dyspnea  Congestive cardiac failure ([place])   No old EKG was available for comparison. Patient did have a right bundle branch block with some degree of ischemic appearance. Patient presents in congestive heart failure.  Treatment was initiated for heart failure. He has responded positively to supplemental oxygen. Patient evaluated by Dr. Stanford Breed in the emergency department with ongoing care of congestive heart failure and atrial fibrillation.    Charlesetta Shanks, MD 12/13/14 [phone]

## 2014-12-03 NOTE — Progress Notes (Signed)
PM rounds Patient confused but denies CP or dyspnea; remains on 100 % rebreather with sats 93. Quick look bedside echo shows anteroseptal and apical akinesis with overall severely reduced LV function; patient with minimal urine output despite lasix. Will hold coreg. Add milrinone 0.125 mg daily. Will place foley catheter. Prognosis appears to be grim. Discussed by phone with patient's wife and explained patient unlikely to survive this event and she stated she understood. She will contact children. She affirmed that patient is NCB and would not want catheterization or other aggressive measures (medical therapy only). I think this is appropriate as he is nearing 81 years of age and apparently is having progressive problems with his memory. Kirk Ruths

## 2014-12-03 NOTE — Progress Notes (Signed)
ANTICOAGULATION CONSULT NOTE - Initial Consult  Pharmacy Consult for heparin Indication: chest pain/ACS  No Known Allergies  Patient Measurements: Height: 5\' 6"  (167.6 cm) Weight: 170 lb (77.111 kg) IBW/kg (Calculated) : 63.8 Heparin Dosing Weight: 77kg  Vital Signs: Temp: 97.1 F (36.2 C) (11/20 0843) Temp Source: Temporal (11/20 0843) BP: 111/78 mmHg (11/20 1130) Pulse Rate: 70 (11/20 1141)  Labs:  Recent Labs  11/20/2014 1007  HGB 17.2*  HCT 53.5*  PLT 219  CREATININE 2.14*  TROPONINI >65.00*    Estimated Creatinine Clearance: 19.7 mL/min (by C-G formula based on Cr of 2.14).   Medical History: Past Medical History  Diagnosis Date  . Prostate cancer ([place])     1980s, s/p seed implants  . Essential hypertension     mild  . Atrial fibrillation ([place])     off and on since 2014 or so  . Psoriasis     Medications:  Infusions:  . heparin      Assessment: 32 yof presented to the ED with SOB. Known history of afib but not on any anticoagulation. Troponin found to be >65. To start IV heparin. Baseline H/H elevated and platelets are WNL.  Goal of Therapy:  Heparin level 0.3-0.7 units/ml Monitor platelets by anticoagulation protocol: Yes   Plan:  - Heparin bolus 4000 units IV x 1 - Heparin gtt 1000 units/hr - Check an 8 hour heparin level - Daily heparin level and CBC  Dewey Neukam, Rande Lawman 12/13/2014,12:08 PM

## 2014-12-04 ENCOUNTER — Inpatient Hospital Stay (HOSPITAL_COMMUNITY): Payer: Medicare Other

## 2014-12-04 ENCOUNTER — Encounter (HOSPITAL_COMMUNITY): Payer: Self-pay

## 2014-12-04 DIAGNOSIS — I255 Ischemic cardiomyopathy: Secondary | ICD-10-CM

## 2014-12-04 DIAGNOSIS — I4891 Unspecified atrial fibrillation: Secondary | ICD-10-CM

## 2014-12-04 LAB — BASIC METABOLIC PANEL
Anion gap: 14 (ref 5–15)
BUN: 50 mg/dL — ABNORMAL HIGH (ref 6–20)
CALCIUM: 8.9 mg/dL (ref 8.9–10.3)
CO2: 23 mmol/L (ref 22–32)
CREATININE: 2.43 mg/dL — AB (ref 0.61–1.24)
Chloride: 102 mmol/L (ref 101–111)
GFR calc Af Amer: 24 mL/min — ABNORMAL LOW (ref >60)
GFR, EST NON AFRICAN AMERICAN: 21 mL/min — AB (ref >60)
GLUCOSE: 145 mg/dL — AB (ref 65–99)
Potassium: 5.1 mmol/L (ref 3.5–5.1)
Sodium: 139 mmol/L (ref 135–145)

## 2014-12-04 LAB — CBC
HEMATOCRIT: 48.8 % (ref 39.0–52.0)
Hemoglobin: 16.4 g/dL (ref 13.0–17.0)
MCH: 31.4 pg (ref 26.0–34.0)
MCHC: 33.6 g/dL (ref 30.0–36.0)
MCV: 93.3 fL (ref 78.0–100.0)
PLATELETS: 191 10*3/uL (ref 150–400)
RBC: 5.23 MIL/uL (ref 4.22–5.81)
RDW: 14.3 % (ref 11.5–15.5)
WBC: 17.3 10*3/uL — ABNORMAL HIGH (ref 4.0–10.5)

## 2014-12-04 LAB — HEPARIN LEVEL (UNFRACTIONATED)
HEPARIN UNFRACTIONATED: 0.91 [IU]/mL — AB (ref 0.30–0.70)
HEPARIN UNFRACTIONATED: 0.33 [IU]/mL (ref 0.30–0.70)

## 2014-12-04 LAB — HEMOGLOBIN A1C
HEMOGLOBIN A1C: 5.7 % — AB (ref 4.8–5.6)
MEAN PLASMA GLUCOSE: 117 mg/dL

## 2014-12-04 LAB — LIPID PANEL
CHOL/HDL RATIO: 4.6 ratio
Cholesterol: 236 mg/dL — ABNORMAL HIGH (ref 0–200)
HDL: 51 mg/dL (ref >40)
LDL CALC: 165 mg/dL — AB (ref 0–99)
Triglycerides: 98 mg/dL (ref <150)
VLDL: 20 mg/dL (ref 0–40)

## 2014-12-04 LAB — TROPONIN I

## 2014-12-04 MED ORDER — FUROSEMIDE 10 MG/ML IJ SOLN
INTRAMUSCULAR | Status: AC
  Filled 2014-12-04: qty 8

## 2014-12-04 MED ORDER — PERFLUTREN LIPID MICROSPHERE
1.0000 mL | INTRAVENOUS | Status: AC | PRN
Start: 2014-12-04 — End: 2014-12-04
  Filled 2014-12-04: qty 10

## 2014-12-04 MED ORDER — AMIODARONE LOAD VIA INFUSION
150.0000 mg | Freq: Once | INTRAVENOUS | Status: AC
  Administered 2014-12-04: 150 mg via INTRAVENOUS
  Filled 2014-12-04: qty 83.34

## 2014-12-04 MED ORDER — FUROSEMIDE 10 MG/ML IJ SOLN
80.0000 mg | Freq: Once | INTRAMUSCULAR | Status: AC
  Administered 2014-12-04: 80 mg via INTRAVENOUS

## 2014-12-04 MED ORDER — AMIODARONE HCL IN DEXTROSE 360-4.14 MG/200ML-% IV SOLN
30.0000 mg/h | INTRAVENOUS | Status: DC
  Administered 2014-12-04 – 2014-12-05 (×2): 30 mg/h via INTRAVENOUS
  Filled 2014-12-04 (×3): qty 200

## 2014-12-04 MED ORDER — METOPROLOL TARTRATE 1 MG/ML IV SOLN
2.5000 mg | Freq: Three times a day (TID) | INTRAVENOUS | Status: DC
  Administered 2014-12-04: 2.5 mg via INTRAVENOUS
  Filled 2014-12-04: qty 5

## 2014-12-04 MED ORDER — AMIODARONE HCL IN DEXTROSE 360-4.14 MG/200ML-% IV SOLN
60.0000 mg/h | INTRAVENOUS | Status: AC
  Administered 2014-12-04 (×2): 60 mg/h via INTRAVENOUS
  Filled 2014-12-04: qty 200

## 2014-12-04 MED ORDER — MORPHINE SULFATE (PF) 2 MG/ML IV SOLN
INTRAVENOUS | Status: AC
  Administered 2014-12-04: 2 mg via INTRAVENOUS
  Filled 2014-12-04: qty 1

## 2014-12-04 MED ORDER — MORPHINE SULFATE (PF) 2 MG/ML IV SOLN
2.0000 mg | INTRAVENOUS | Status: DC | PRN
  Administered 2014-12-04 – 2014-12-05 (×7): 2 mg via INTRAVENOUS
  Filled 2014-12-04 (×6): qty 1

## 2014-12-04 MED ORDER — HALOPERIDOL LACTATE 5 MG/ML IJ SOLN
2.0000 mg | Freq: Four times a day (QID) | INTRAMUSCULAR | Status: DC | PRN
  Administered 2014-12-04: 2 mg via INTRAVENOUS
  Filled 2014-12-04: qty 1

## 2014-12-04 MED ORDER — PERFLUTREN LIPID MICROSPHERE
INTRAVENOUS | Status: AC
  Administered 2014-12-04: 2 mL
  Filled 2014-12-04: qty 10

## 2014-12-04 NOTE — Progress Notes (Signed)
Inpatient Diabetes Program Recommendations  AACE/ADA: New Consensus Statement on Inpatient Glycemic Control (2015)  Target Ranges:  Prepandial:   less than 140 mg/dL      Peak postprandial:   less than 180 mg/dL (1-2 hours)      Critically ill patients:  140 - 180 mg/dL  Results for Stephen Molina, Stephen Molina (MRN [id number]) as of 12/04/2014 08:56  Ref. Range 12/04/2014 10:07 12/04/2014 01:20  Glucose Latest Ref Range: 65-99 mg/dL 232 (H) 145 (H)   Review of Glycemic Control  Diabetes history: NO Outpatient Diabetes medications: NA Current orders for Inpatient glycemic control: None  Inpatient Diabetes Program Recommendations: Correction (SSI): Initial glucose 232 mg/d on 11/20 and lab glucose 145 mg/dl this morning at 1:20 am. If appropriate, may want to consider ordering CBGs with Novolog correction scale. HgbA1C: Please consider ordering an A1C to evaluate glycemic control over the past 2-3 months.  Thanks, Barnie Alderman, RN, MSN, CDE Diabetes Coordinator Inpatient Diabetes Program [phone] (Team Pager from [place] to [place]) [phone] ([place] office) [phone] [facility] office) [phone] [facility] office)

## 2014-12-04 NOTE — Progress Notes (Signed)
PM Rounds Patient is sedated with morphine and Haldol. Saturations at present 99%. Patient remains in atrial fibrillation with a rate of 110-120. Blood pressure 90/73. Chest x-ray today shows worsening CHF. Renal function is also worse and patient is not making much urine. Echocardiogram shows severe LV dysfunction and moderate RV dysfunction. Long discussion with entire family today including wife, stepchildren and biological children. 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 They state his quality of life was deteriorating prior to admission. He would not want heroic measures. We have decided to continue with present measures including milrinone, amiodarone and Lasix for today and tonight. If he does not make significant improvement (with regards to CHF, stabilization of renal function, etc) then our goal will be comfort care only. We will withdraw all the above medications and treat with morphine. I would ask palliative care tomorrow morning to assist. The family is in agreement. I spent greater than 30 minutes in discussions with family throughout the day today. Kirk Ruths

## 2014-12-04 NOTE — Progress Notes (Signed)
ANTICOAGULATION CONSULT NOTE - Follow Up Consult  Pharmacy Consult for Heparin Indication: NSTEMI  No Known Allergies  Patient Measurements: Height: 5\' 8"  (172.7 cm) Weight: 135 lb 3.2 oz (61.326 kg) IBW/kg (Calculated) : 68.4 Heparin Dosing Weight:  61.3 kg  Vital Signs: Temp: 97.3 F (36.3 C) (11/21 1658) Temp Source: Axillary (11/21 1658) BP: 94/68 mmHg (11/21 1658) Pulse Rate: 141 (11/21 0815)  Labs:  Recent Labs  11/26/2014 1007 11/28/2014 1300 11/21/2014 1824 11/25/2014 2150 12/04/14 0120 12/04/14 0715 12/04/14 1707  HGB 17.2*  --   --   --  16.4  --   --   HCT 53.5*  --   --   --  48.8  --   --   PLT 219  --   --   --  191  --   --   LABPROT  --  15.2  --   --   --   --   --   INR  --  1.18  --   --   --   --   --   HEPARINUNFRC  --   --   --  0.86*  --  0.91* 0.33  CREATININE 2.14*  --   --   --  2.43*  --   --   TROPONINI >65.00* >65.00* >65.00*  --  >80.00*  --   --     Estimated Creatinine Clearance: 15.4 mL/min (by C-G formula based on Cr of 2.43).   Assessment: CC: SOB  PMH: prostate ca, HTN, afib, psoriasis  AC: Heparin for CP/ACS, Afib. Hgb 17.2>16.4, Plts 219>191. Heparin level 0.33 on 650 units/hr  Cardiovascular: CHF, +Afib. Troponins >65, >65, >80. Holding BB with CHF. No ACEI with renal. Meds: IV Amio started, IV milrinone at 0.125, ASA81, Lipitor80, Plavix, IV Lasix  Goal of Therapy:  Heparin level 0.3-0.7 units/ml Monitor platelets by anticoagulation protocol: Yes   Plan:  No cath, medical management Continue IV heparin at 650 units/hr Daily CBC, HL Monitor for s/sx of bleeding F/U LOT  Levester Fresh, PharmD, [facility] Clinical Pharmacist Pager [phone] 12/04/2014 6:37 PM

## 2014-12-04 NOTE — Progress Notes (Signed)
Md made aware of pt's hr 114-150's afib.  No new orders at this time.  Family asleep at bedside.  Pt restless and tries to pull at things.  Mittens off due to increases agitation.  Sitter at bedside.  Will continue to monitor Saunders Revel T

## 2014-12-04 NOTE — Progress Notes (Signed)
  Echocardiogram 2D Echocardiogram with Definity has been performed.  Ailie Gage, [place] 12/04/2014, 11:18 AM

## 2014-12-04 NOTE — Progress Notes (Signed)
ANTICOAGULATION CONSULT NOTE - Follow Up Consult  Pharmacy Consult for Heparin Indication: NSTEMI  No Known Allergies  Patient Measurements: Height: 5\' 8"  (172.7 cm) Weight: 135 lb 3.2 oz (61.326 kg) IBW/kg (Calculated) : 68.4 Heparin Dosing Weight:  61.3 kg  Vital Signs: Temp: 97.1 F (36.2 C) (11/21 0815) Temp Source: Oral (11/21 0815) BP: 97/79 mmHg (11/21 0815) Pulse Rate: 141 (11/21 0815)  Labs:  Recent Labs  11/24/2014 1007 12/07/2014 1300 12/05/2014 1824 11/18/2014 2150 12/04/14 0120 12/04/14 0715  HGB 17.2*  --   --   --  16.4  --   HCT 53.5*  --   --   --  48.8  --   PLT 219  --   --   --  191  --   LABPROT  --  15.2  --   --   --   --   INR  --  1.18  --   --   --   --   HEPARINUNFRC  --   --   --  0.86*  --  0.91*  CREATININE 2.14*  --   --   --  2.43*  --   TROPONINI >65.00* >65.00* >65.00*  --  >80.00*  --     Estimated Creatinine Clearance: 15.4 mL/min (by C-G formula based on Cr of 2.43).   Medications:  Prescriptions prior to admission  Medication Sig Dispense Refill Last Dose  . aspirin 81 MG tablet Take 81 mg by mouth daily.   12/13/2014 at Unknown time  . furosemide (LASIX) 20 MG tablet Take 20 mg by mouth daily.   12/02/2014 at Unknown time    Assessment: CC: SOB  PMH: prostate ca, HTN, afib, psoriasis  AC: Heparin for CP/ACS, Afib. Hgb 17.2>16.4, Plts 219>191. Heparin level 0.86>0.91.  Cardiovascular: CHF, +Afib. Troponins >65, >65, >80. Holding BB with CHF. No ACEI with renal. Meds: IV Amio started, IV milrinone at 0.125, ASA81, Lipitor80, Plavix, IV Lasix  Goal of Therapy:  Heparin level 0.3-0.7 units/ml Monitor platelets by anticoagulation protocol: Yes   Plan:  No cath, medical management Decrease IV heparin to 650 units/hr Recheck heparin level in 8 hrs.   Shalaya Swailes S. Alford Highland, PharmD, [place] Clinical Staff Pharmacist Pager [phone]  [place], [place] 12/04/2014,9:07 AM

## 2014-12-04 NOTE — Progress Notes (Addendum)
    Subjective:  Denies CP or dyspnea; confused; oriented to person   Objective:  Filed Vitals:   11/23/2014 2100 12/09/2014 2337 12/13/2014 2345 12/04/14 0339  BP:   102/79 95/70  Pulse: 115  116   Temp:  98.1 F (36.7 C)  97.6 F (36.4 C)  TempSrc:  Axillary  Axillary  Resp: 34  25 30  Height:      Weight:    61.326 kg (135 lb 3.2 oz)  SpO2: 89%  97% 86%    Intake/Output from previous day:  Intake/Output Summary (Last 24 hours) at 12/04/14 0740 Last data filed at 12/04/14 0600  Gross per 24 hour  Intake  405.6 ml  Output    700 ml  Net -294.4 ml    Physical Exam: Physical exam: Well-developed frail in no acute distress.  Skin is warm and dry.  HEENT is normal.  Neck is supple Chest basilar crackles Cardiovascular exam is tachycardic and irregular Abdominal exam nontender or distended. No masses palpated. Extremities show no edema. neuro grossly intact    Lab Results: Basic Metabolic Panel:  Recent Labs  11/20/2014 1007 12/04/2014 1300 12/04/14 0120  NA 136  --  139  K 4.1  --  5.1  CL 98*  --  102  CO2 18*  --  23  GLUCOSE 232*  --  145*  BUN 37*  --  50*  CREATININE 2.14*  --  2.43*  CALCIUM 9.2  --  8.9  MG  --  2.4  --    CBC:  Recent Labs  11/15/2014 1007 12/04/14 0120  WBC 16.3* 17.3*  NEUTROABS 14.7*  --   HGB 17.2* 16.4  HCT 53.5* 48.8  MCV 97.3 93.3  PLT 219 191   Cardiac Enzymes:  Recent Labs  11/17/2014 1300 11/23/2014 1824 12/04/14 0120  TROPONINI >65.00* >65.00* >80.00*     Assessment/Plan:  1 non-ST elevation myocardial infarction-the patient presented late following a large infarct. Long discussion again with patient's wife and stepchildren. Patient is a no CODE BLUE with no intubation, defibrillation or CPR. They do not want aggressive procedures such as catheterization and I do not believe he is a good candidate for this regardless. Plan medical therapy. Continue aspirin, Plavix, heparin and statin. Continue milrinone. There is  persistent crackles on examination. Will continue diuresis. Follow renal function closely. No beta blocker given CHF. No ACE inhibitor given renal insufficiency. Blood pressure borderline. I will not add hydralazine/nitrates. Quick look bedside echocardiogram last evening showed severe LV dysfunction. Will order full study today.  2 Acute kidney disease-likely secondary to recent infarct and hypoperfusion. He continues with pulmonary edema on examination. Continue Lasix and follow renal function. Repeat chest x-ray. Continue milrinone. 3 paroxysmal atrial fibrillation-the patient is in atrial fibrillation this morning. I will add IV amiodarone. CHADS vasc 5; patient is on heparin. 4 confusion-patient appears to have had memory issues at home which is worsened by his hospitalization and hypoxia.  Stephen Molina 12/04/2014, 7:40 AM

## 2014-12-05 LAB — BASIC METABOLIC PANEL
ANION GAP: 15 (ref 5–15)
BUN: 69 mg/dL — AB (ref 6–20)
CHLORIDE: 99 mmol/L — AB (ref 101–111)
CO2: 23 mmol/L (ref 22–32)
Calcium: 8.4 mg/dL — ABNORMAL LOW (ref 8.9–10.3)
Creatinine, Ser: 2.95 mg/dL — ABNORMAL HIGH (ref 0.61–1.24)
GFR calc Af Amer: 19 mL/min — ABNORMAL LOW (ref >60)
GFR, EST NON AFRICAN AMERICAN: 17 mL/min — AB (ref >60)
Glucose, Bld: 133 mg/dL — ABNORMAL HIGH (ref 65–99)
POTASSIUM: 4.6 mmol/L (ref 3.5–5.1)
SODIUM: 137 mmol/L (ref 135–145)

## 2014-12-05 LAB — CBC
HEMATOCRIT: 44.8 % (ref 39.0–52.0)
HEMOGLOBIN: 15.5 g/dL (ref 13.0–17.0)
MCH: 31.6 pg (ref 26.0–34.0)
MCHC: 34.6 g/dL (ref 30.0–36.0)
MCV: 91.4 fL (ref 78.0–100.0)
Platelets: 185 10*3/uL (ref 150–400)
RBC: 4.9 MIL/uL (ref 4.22–5.81)
RDW: 14.6 % (ref 11.5–15.5)
WBC: 13.2 10*3/uL — AB (ref 4.0–10.5)

## 2014-12-05 LAB — HEPARIN LEVEL (UNFRACTIONATED): Heparin Unfractionated: 0.2 IU/mL — ABNORMAL LOW (ref 0.30–0.70)

## 2014-12-05 MED ORDER — GLYCOPYRROLATE 0.2 MG/ML IJ SOLN
0.2000 mg | INTRAMUSCULAR | Status: DC | PRN
  Filled 2014-12-05: qty 1

## 2014-12-05 MED ORDER — ONDANSETRON 4 MG PO TBDP: 4.0000 mg | ORAL_TABLET | Freq: Four times a day (QID) | ORAL | Status: DC | PRN

## 2014-12-05 MED ORDER — ONDANSETRON HCL 4 MG/2ML IJ SOLN
4.0000 mg | Freq: Four times a day (QID) | INTRAMUSCULAR | Status: DC | PRN
Start: 2014-12-05 — End: 2014-12-06

## 2014-12-05 MED ORDER — GLYCOPYRROLATE 0.2 MG/ML IJ SOLN
0.2000 mg | Freq: Two times a day (BID) | INTRAMUSCULAR | Status: DC
  Administered 2014-12-05 – 2014-12-06 (×3): 0.2 mg via INTRAVENOUS
  Filled 2014-12-05 (×4): qty 1

## 2014-12-05 MED ORDER — HALOPERIDOL 1 MG PO TABS: 0.5000 mg | ORAL_TABLET | ORAL | Status: DC | PRN

## 2014-12-05 MED ORDER — SODIUM CHLORIDE 0.9 % IV SOLN
1.0000 mg/h | INTRAVENOUS | Status: DC
  Administered 2014-12-05: 0.2 mg/h via INTRAVENOUS
  Filled 2014-12-05 (×2): qty 5

## 2014-12-05 MED ORDER — ACETAMINOPHEN 650 MG RE SUPP: 650.0000 mg | Freq: Four times a day (QID) | RECTAL | Status: DC | PRN

## 2014-12-05 MED ORDER — BISACODYL 10 MG RE SUPP: 10.0000 mg | Freq: Every day | RECTAL | Status: DC | PRN

## 2014-12-05 MED ORDER — HALOPERIDOL LACTATE 2 MG/ML PO CONC: 0.5000 mg | ORAL | Status: DC | PRN

## 2014-12-05 MED ORDER — HALOPERIDOL LACTATE 5 MG/ML IJ SOLN: 0.5000 mg | INTRAMUSCULAR | Status: DC | PRN

## 2014-12-05 MED ORDER — BIOTENE DRY MOUTH MT LIQD: 15.0000 mL | OROMUCOSAL | Status: DC | PRN

## 2014-12-05 MED ORDER — ACETAMINOPHEN 325 MG PO TABS: 650.0000 mg | ORAL_TABLET | Freq: Four times a day (QID) | ORAL | Status: DC | PRN

## 2014-12-05 MED ORDER — LORAZEPAM 2 MG/ML IJ SOLN: 1.0000 mg | INTRAMUSCULAR | Status: DC | PRN

## 2014-12-05 MED ORDER — GLYCOPYRROLATE 1 MG PO TABS
1.0000 mg | ORAL_TABLET | ORAL | Status: DC | PRN
  Filled 2014-12-05: qty 1

## 2014-12-05 MED ORDER — WHITE PETROLATUM GEL
Status: AC
  Administered 2014-12-05: 12:00:00
  Filled 2014-12-05: qty 1

## 2014-12-05 MED ORDER — HYDROMORPHONE BOLUS VIA INFUSION
0.5000 mg | INTRAVENOUS | Status: DC | PRN
  Filled 2014-12-05: qty 1

## 2014-12-05 MED ORDER — SODIUM CHLORIDE 0.9 % IV SOLN
INTRAVENOUS | Status: DC
  Administered 2014-12-05: 16:00:00 via INTRAVENOUS

## 2014-12-05 MED ORDER — POLYVINYL ALCOHOL 1.4 % OP SOLN: 1.0000 [drp] | Freq: Four times a day (QID) | OPHTHALMIC | Status: DC | PRN

## 2014-12-05 NOTE — Progress Notes (Signed)
ANTICOAGULATION CONSULT NOTE - Follow Up Consult  Pharmacy Consult for Heparin Indication: NSTEMI  No Known Allergies  Patient Measurements: Height: 5\' 8"  (172.7 cm) Weight: 135 lb 5.8 oz (61.4 kg) IBW/kg (Calculated) : 68.4 Heparin Dosing Weight:  61.3 kg  Vital Signs: Temp: 97.9 F (36.6 C) (11/22 0347) Temp Source: Axillary (11/22 0347) BP: 100/69 mmHg (11/22 0347)  Labs:  Recent Labs  11/16/2014 1007 12/05/2014 1300 11/22/2014 1824  12/04/14 0120 12/04/14 0715 12/04/14 1707 12/05/14 0513  HGB 17.2*  --   --   --  16.4  --   --   --   HCT 53.5*  --   --   --  48.8  --   --   --   PLT 219  --   --   --  191  --   --   --   LABPROT  --  15.2  --   --   --   --   --   --   INR  --  1.18  --   --   --   --   --   --   HEPARINUNFRC  --   --   --   < >  --  0.91* 0.33 0.20*  CREATININE 2.14*  --   --   --  2.43*  --   --   --   TROPONINI >65.00* >65.00* >65.00*  --  >80.00*  --   --   --   < > = values in this interval not displayed.  Estimated Creatinine Clearance: 15.4 mL/min (by C-G formula based on Cr of 2.43).   Assessment: 81 y.o. M on heparin for NSTEMI. Heparin level down to 0.2 (subtherapeutic) on 650 units/hr. No issues with line or bleeding reported per RN.  Goal of Therapy:  Heparin level 0.3-0.7 units/ml Monitor platelets by anticoagulation protocol: Yes   Plan:  Increase IV heparin to 800 units/hr F/u 8 hour heparin level F/U LOT; palliative care discussions  Levester Fresh, PharmD, BCPS Clinical Pharmacist Pager [phone] 12/05/2014 5:58 AM

## 2014-12-05 NOTE — Progress Notes (Signed)
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 He was mostly unresponsive during my visit. Palliative order set entered. Started on Hydromorphone infusion at 0.2/hr and 0.5 bolus given his renal function and due to risk of agitation and neurotoxicity with use of morphine. Robinul ordered BID for his secretions- may need to be turned and repositioned if congestion persists. Leave Lasix for comfort- may even consider increasing. I discussed prognosis and options for his care including transferring to [place] or continued inpatient environment. 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 Palliative will follow closely.  Lane Hacker, DO Palliative Medicine [phone]

## 2014-12-05 NOTE — Progress Notes (Signed)
Nutrition Brief Note  Chart reviewed. Pt now transitioning to comfort care.  No further nutrition interventions warranted at this time.  Please re-consult as needed.   Era Parr A. Jerid Catherman, RD, LDN, CDE Pager: [phone] After hours Pager: [phone]  

## 2014-12-05 NOTE — Progress Notes (Signed)
    Subjective:  Confused but responds; Denies CP or dyspnea   Objective:  Filed Vitals:   12/04/14 2335 12/05/14 0347 12/05/14 0408 12/05/14 0721  BP: 85/61 100/69  106/68  Pulse:      Temp: 98.5 F (36.9 C) 97.9 F (36.6 C)  97.4 F (36.3 C)  TempSrc: Axillary Axillary  Axillary  Resp: 19 18 20 16   Height:      Weight:  61.4 kg (135 lb 5.8 oz)    SpO2: 98% 98% 98% 100%    Intake/Output from previous day:  Intake/Output Summary (Last 24 hours) at 12/05/14 0919 Last data filed at 12/05/14 0900  Gross per 24 hour  Intake 1366.33 ml  Output    595 ml  Net 771.33 ml    Physical Exam: Physical exam: Well-developed frail in no acute distress.  Skin is warm and dry.  HEENT is normal.  Neck is supple Chest diminished BS bases Cardiovascular exam is tachycardic and irregular Abdominal exam nontender or distended. No masses palpated. Extremities show no edema. neuro grossly intact    Lab Results: Basic Metabolic Panel:  Recent Labs  12/07/2014 1300 12/04/14 0120 12/05/14 0513  NA  --  139 137  K  --  5.1 4.6  CL  --  102 99*  CO2  --  23 23  GLUCOSE  --  145* 133*  BUN  --  50* 69*  CREATININE  --  2.43* 2.95*  CALCIUM  --  8.9 8.4*  MG 2.4  --   --    CBC:  Recent Labs  11/22/2014 1007 12/04/14 0120 12/05/14 0513  WBC 16.3* 17.3* 13.2*  NEUTROABS 14.7*  --   --   HGB 17.2* 16.4 15.5  HCT 53.5* 48.8 44.8  MCV 97.3 93.3 91.4  PLT 219 191 185   Cardiac Enzymes:  Recent Labs  12/12/2014 1300 11/24/2014 1824 12/04/14 0120  TROPONINI >65.00* >65.00* >80.00*     Assessment/Plan:  1 non-ST elevation myocardial infarction-the patient presented late following a large infarct. Echo shows severe LV dysfunction and moderate RV dysfunction. His renal function continues to deteriorate and pulmonary edema persists on exam. UOP not good. Long discussion again with patient's wife and family. Patient is a no CODE BLUE with no intubation, defibrillation or CPR.  They do not want aggressive procedures such as catheterization and I do not believe he is a good candidate for this regardless. They do not want to pursue cardioversion for his atrial fibrillation. They feel he would not want aggressive measures to prolong his life. They have requested palliative care consult which I think is appropriate. They want milrinone, heparin, amiodarone DCed. Will continue lasix for now to see if improving his pulmonary edema will keep him more comfortable.   2 Acute kidney disease-likely secondary to recent infarct and hypoperfusion. He continues with pulmonary edema on examination. Continue Lasix. 3 paroxysmal atrial fibrillation-the patient remains in atrial fibrillation. Family requests heparin and amiodarone DCed. 4 confusion-patient appears to have had memory issues at home which is worsened by his hospitalization and hypoxia.  Kirk Ruths 12/05/2014, 9:19 AM

## 2014-12-11 ENCOUNTER — Telehealth: Payer: Self-pay | Admitting: Cardiology

## 2014-12-11 NOTE — Telephone Encounter (Signed)
04-Jan-2015 Received death certificate from [facility] on patient for Dr. Stanford Breed to fill out, death certificate was given to Neoma Laming Dr. Stanford Breed nurse for him to fill out. cbr  01/04/2015 Received sign death certificate back from Neoma Laming that Dr. Stanford Breed fill out, I then made copy for our records and called funeral home to come pick up.  cbr

## 2014-12-14 NOTE — Discharge Summary (Signed)
CARDIOLOGY DISCHARGE SUMMARY   Patient ID: Stephen Molina MRN: [id number] DOB/AGE: [birth year] 81 y.o.  Admit date: 12/09/2014 Discharge date: 12-17-14  PCP: No primary care provider on file. 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  He had noted gradually increasing DOE prior to admission. However, his SOB became acutely worse the night before admission. When symptoms did not improve he came to the ER, where he was in acute heart failure, and initial cardiac enzymes were elevated. He was admitted for further evaluation and treatment.   His initial troponin was > 65, a repeat was greater than 85. He remained chest-pain free. After discussion with his family, medical therapy was felt to be the best option. He was given ASA 324 mg. He could not be on a statin because of abnormal LFTs. His BP was too low for a BB. His renal function was poor, so no ACE inhibitor was ordered.  His wife was present with him in the ER. A discussion was held regarding the aggressiveness of treatment. He was having problems with confusion at times, but had expressed a wish for no extraordinary measures. He was having no chest pain. He was made a DNR at his request.  He was in heart failure, and was diuresed with IV Lasix. His respiratory status improved had minimal improvement. He was tachycardic and in rapid atrial fibrillation at times. He is not an long-term anticoagulation candidate due to a history of falls, but was started on heparin for a short-term option.   An echocardiogram was performed which showed an EF of 15%. He was diuresing,  but still very SOB and his renal function was worsening. His LFTs were elevated as well. Milrinone was added for an inotrope and amiodarone had been to help with rate control for his atrial fibrillation. However, with worsening hepatic function, the amiodarone was discontinued.  Further discussion was held with the family regarding the aggressiveness of care. They prefer non-aggressive management, and comfort care was felt to be the best option. The family requested the milrinone, amiodarone and heparin be discontinued. They requested a Palliative Care consult.  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 Other medications were used to keep him comfortable, others discontinued. 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   On 11/23, [name] was seen by Dr Stanford Breed and Dr Hilma Favors. The Chaplain offered spiritual care. 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   Labs:   Lab Results  Component Value Date   WBC 13.2* 12/05/2014   HGB 15.5 12/05/2014   HCT 44.8 12/05/2014   MCV 91.4 12/05/2014   PLT 185 12/05/2014    Recent Labs Lab 11/21/2014 1007  12/05/14 0513  NA 136  < > 137  K 4.1  < > 4.6  CL 98*  < > 99*  CO2 18*  < > 23  BUN 37*  < > 69*  CREATININE 2.14*  < > 2.95*  CALCIUM 9.2  < > 8.4*  PROT 7.0  --   --  BILITOT 0.9  --   --   ALKPHOS 81  --   --   ALT 77*  --   --   AST 475*  --   --   GLUCOSE 232*  < > 133*  < > = values in this interval not displayed.  Recent Labs  11/16/2014 1824 12/04/14 0120  TROPONINI >65.00* >80.00*   Lipid Panel     Component Value Date/Time   CHOL 236* 12/04/2014 0120   TRIG 98 12/04/2014 0120   HDL 51 12/04/2014 0120   CHOLHDL 4.6 12/04/2014 0120   VLDL 20 12/04/2014 0120   LDLCALC 165* 12/04/2014 0120   B NATRIURETIC PEPTIDE  Date/Time Value Ref Range Status  11/28/2014 10:07 AM 4310.4* 0.0 - 100.0 pg/mL Final    Radiology: Dg  Chest 2 View 11/15/2014  CLINICAL DATA:  Shortness of breath. New onset of atrial fibrillation. EXAM: CHEST  2 VIEW COMPARISON:  08/31/2007 FINDINGS: There is new cardiomegaly with diffuse bilateral pulmonary edema as well as small bilateral pleural effusions. Tortuous calcified thoracic aorta. Calcified granulomas in the left hilum. IMPRESSION: Cardiomegaly with bilateral pulmonary edema and small bilateral effusions. Electronically Signed   By: Lorriane Shire M.D.   On: 12/08/2014 09:50   Dg Chest Port 1 View 12/04/2014  CLINICAL DATA:  Congestive heart failure. EXAM: PORTABLE CHEST 1 VIEW COMPARISON:  11/22/2014 FINDINGS: Cardiomegaly with pulmonary vascular prominence and interstitial prominence again noted. Interstitial prominence is increased slightly from prior exam. Small pleural effusions. Findings consistent congestive heart failure with interim slight progression. Calcified left hilar lymph nodes are again noted. No pneumothorax. IMPRESSION: Progressive changes of congestive heart failure with pulmonary interstitial edema . Electronically Signed   By: Marcello Moores  Register   On: 12/04/2014 08:22   EKG: 12/05/2014 Atrial fib, RVR  Echo: 12/04/2014 Study Conclusions - Left ventricle: The cavity size was normal. Wall thickness was normal. The estimated ejection fraction was 15%. The entire left ventricle except for the basal inferolateral, basal inferior, and basal anterolateral wall segments was severely hypokinetic. Indeterminant diastolic dysfunction (atrial fibrillation). No LV thrombus noted. - Aortic valve: There was no stenosis. - Mitral valve: Mildly calcified annulus. There was trivial regurgitation. - Left atrium: The atrium was moderately dilated. - Right ventricle: The cavity size was normal. Systolic function was moderately reduced. - Tricuspid valve: Peak RV-RA gradient (S): 26 mm Hg. - Pulmonary arteries: PA peak pressure: 29 mm Hg (S). - Inferior vena cava:  The vessel was normal in size. The respirophasic diameter changes were in the normal range (>= 50%), consistent with normal central venous pressure. - Pericardium, extracardiac: There is a significant left pleural effusion. Impressions: - Normal LV size with EF 15%. Wall motion abnormalities as noted above. Normal RV size with moderately decreased systolic function.  BRING ALL MEDICATIONS WITH YOU TO FOLLOW UP APPOINTMENTS  Time spent with patient to include physician time: 53 min Signed: Rosaria Ferries, PA-C 12/17/14, 4:19 PM Co-Sign MD

## 2014-12-14 NOTE — Progress Notes (Signed)
   2014-12-14 1400  Clinical Encounter Type  Visited With Family;Health care provider  Visit Type Patient actively dying;Initial  Referral From Palliative care team  Consult/Referral To Chaplain  Spiritual Encounters  Spiritual Needs Emotional;Grief support  CH met with family at bedside; family at peace with pt diagnosis and are bedside as pt transitions; pt actively dying; [place] offered grief and emotional support; [place] available at family request; Gwynn Burly 2:44 PM

## 2014-12-14 NOTE — Progress Notes (Signed)
RN called to room by family because patient stopped breathing.  This was confirmed by myself and Ludwig Clarks, RN.  Time of death 81.  Dr. Stanford Breed notified and came to bedside.  CDS notified and patient is not suitable for organ donation. 56mL Dilaudid drip wasted in sink with Dorris Carnes, RN.  Patient's body to be prepared and sent to morgue. Family is waiting for a few additional visitors.  Bed control will be notified when checklist is complete.

## 2014-12-14 NOTE — Progress Notes (Signed)
    Subjective:  Unresponsive; agonal breathing   Objective:  Filed Vitals:   12/05/14 1600 12/05/14 1700 12/05/14 1743 2014-12-13 0603  BP: 92/70  84/52 78/48  Pulse:   94 45  Temp:   98.5 F (36.9 C) 98 F (36.7 C)  TempSrc:   Axillary Axillary  Resp: 12 15 14 10   Height:      Weight:      SpO2: 98% 95% 87% 82%    Intake/Output from previous day:  Intake/Output Summary (Last 24 hours) at 13-Dec-2014 0832 Last data filed at December 13, 2014 [id number]  Gross per 24 hour  Intake   65.9 ml  Output    450 ml  Net -384.1 ml    Physical Exam: Physical exam: Unresponsive Skin is warm and dry.  HEENT is normal.  Chest diffuse rhonchi Cardiovascular exam is tachycardic and irregular Abdominal exam nondistended. No masses palpated. Extremities show no edema. Neuro unresponsive    Lab Results: Basic Metabolic Panel:  Recent Labs  11/17/2014 1300 12/04/14 0120 12/05/14 0513  NA  --  139 137  K  --  5.1 4.6  CL  --  102 99*  CO2  --  23 23  GLUCOSE  --  145* 133*  BUN  --  50* 69*  CREATININE  --  2.43* 2.95*  CALCIUM  --  8.9 8.4*  MG 2.4  --   --    CBC:  Recent Labs  11/16/2014 1007 12/04/14 0120 12/05/14 0513  WBC 16.3* 17.3* 13.2*  NEUTROABS 14.7*  --   --   HGB 17.2* 16.4 15.5  HCT 53.5* 48.8 44.8  MCV 97.3 93.3 91.4  PLT 219 191 185   Cardiac Enzymes:  Recent Labs  12/01/2014 1300 11/14/2014 1824 12/04/14 0120  TROPONINI >65.00* >65.00* >80.00*     Assessment/Plan:  1 non-ST elevation myocardial infarction-the patient presented late following a large infarct. Echo shows severe LV dysfunction and moderate RV dysfunction. Patient is a no CODE BLUE with no intubation, defibrillation or CPR. Family requested no aggressive measures. He has been transitioned to comfort care only. Appreciate assistance of palliative care team. 2 Acute kidney disease 3 paroxysmal atrial fibrillation-the patient remains in atrial fibrillation on exam.    Kirk Ruths Dec 13, 2014, 8:32 AM

## 2014-12-14 NOTE — Progress Notes (Signed)
Patient seen and large family at bedside. Febrile. Unresponsive. Significant amount of dyspnea through the thick secretions in the back of his throat.  I increased his hydromorphone infusion to 1mg /hr and gave him a bolus at the bedside of .25mg  for his distress. Additionally I suctioned almost 100cc+ of congestion and oral secretions from the back of his throat using deep oral-laryngeal suction with a small cannula.  He will have a hospital death- hours likely. I removed his nasal cannula O2 for comfort. Provided support to the family at bedside.  Lane Hacker, DO Palliative Medicine [phone]

## 2014-12-14 DEATH — deceased

## 2016-04-15 IMAGING — DX DG CHEST 2V
2 series · 2 of 2 positions shown · non-contrast
Comparison: 08/31/2007

CLINICAL DATA: Shortness of breath. New onset of atrial
fibrillation.

EXAM:
CHEST  2 VIEW

[chest lat]
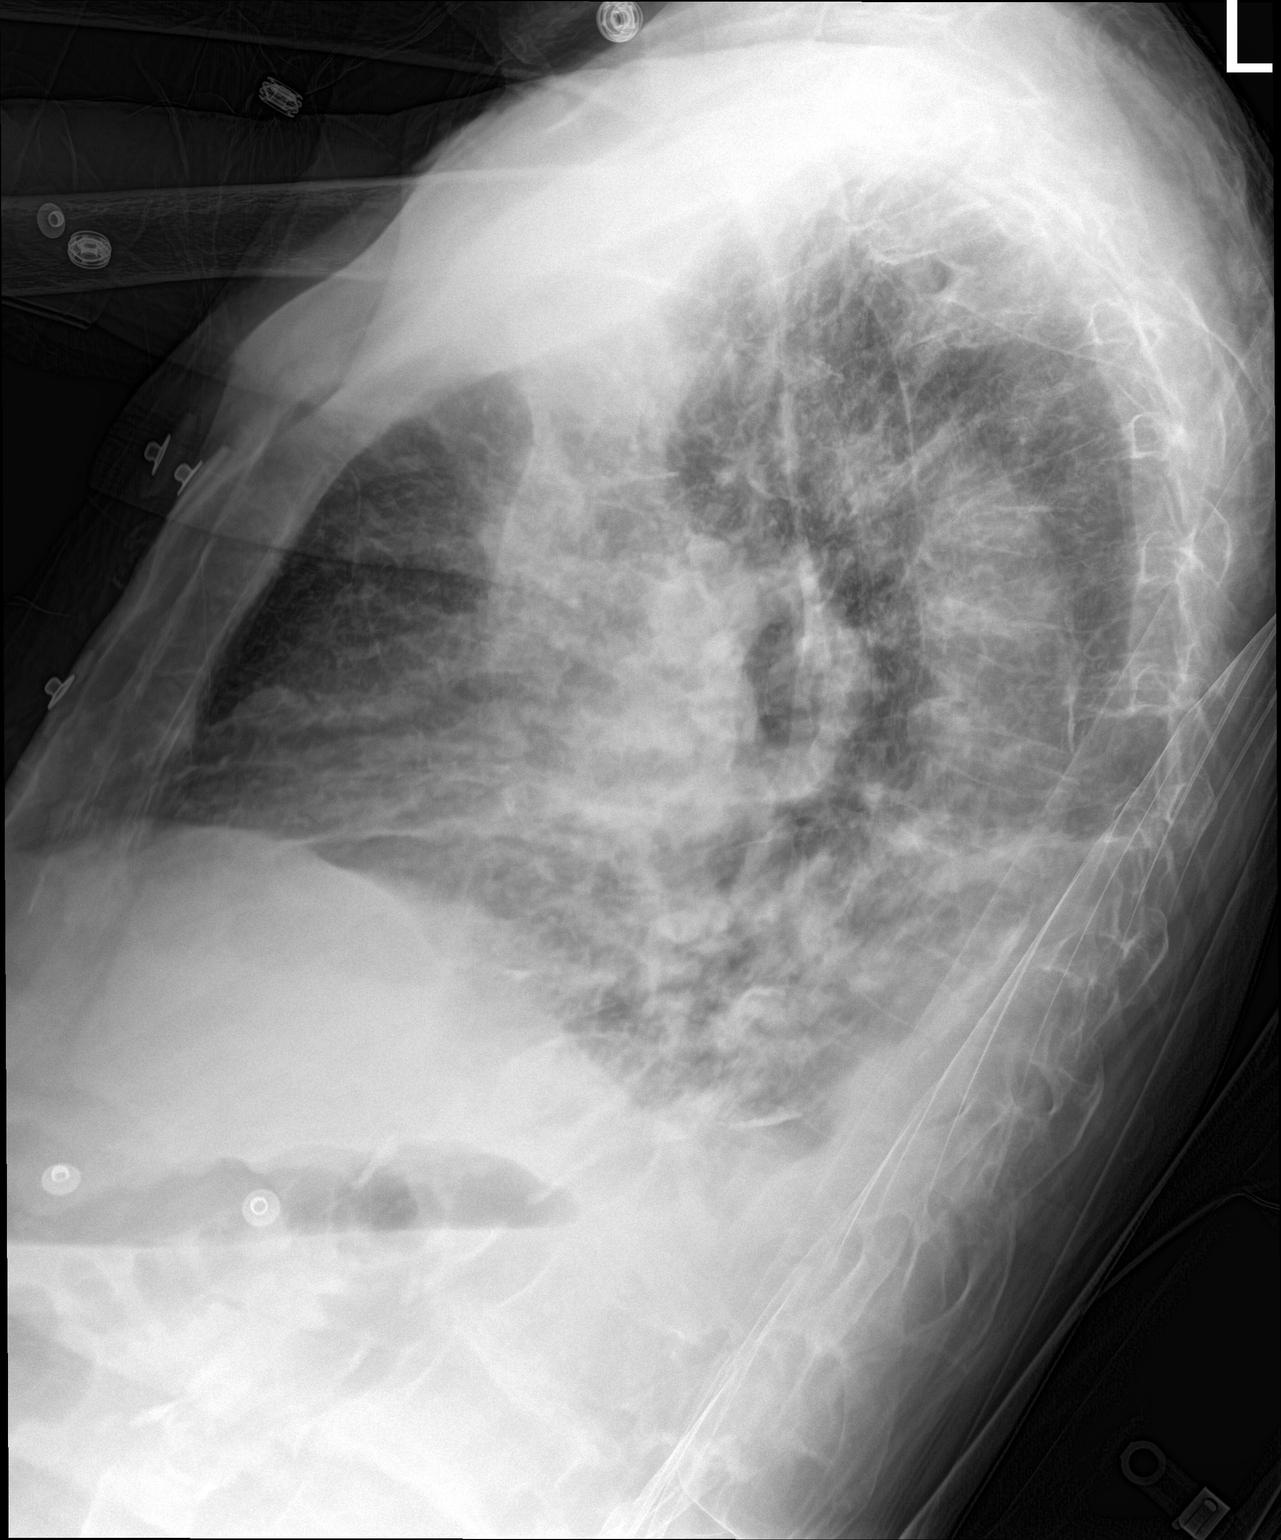

[chest ap]
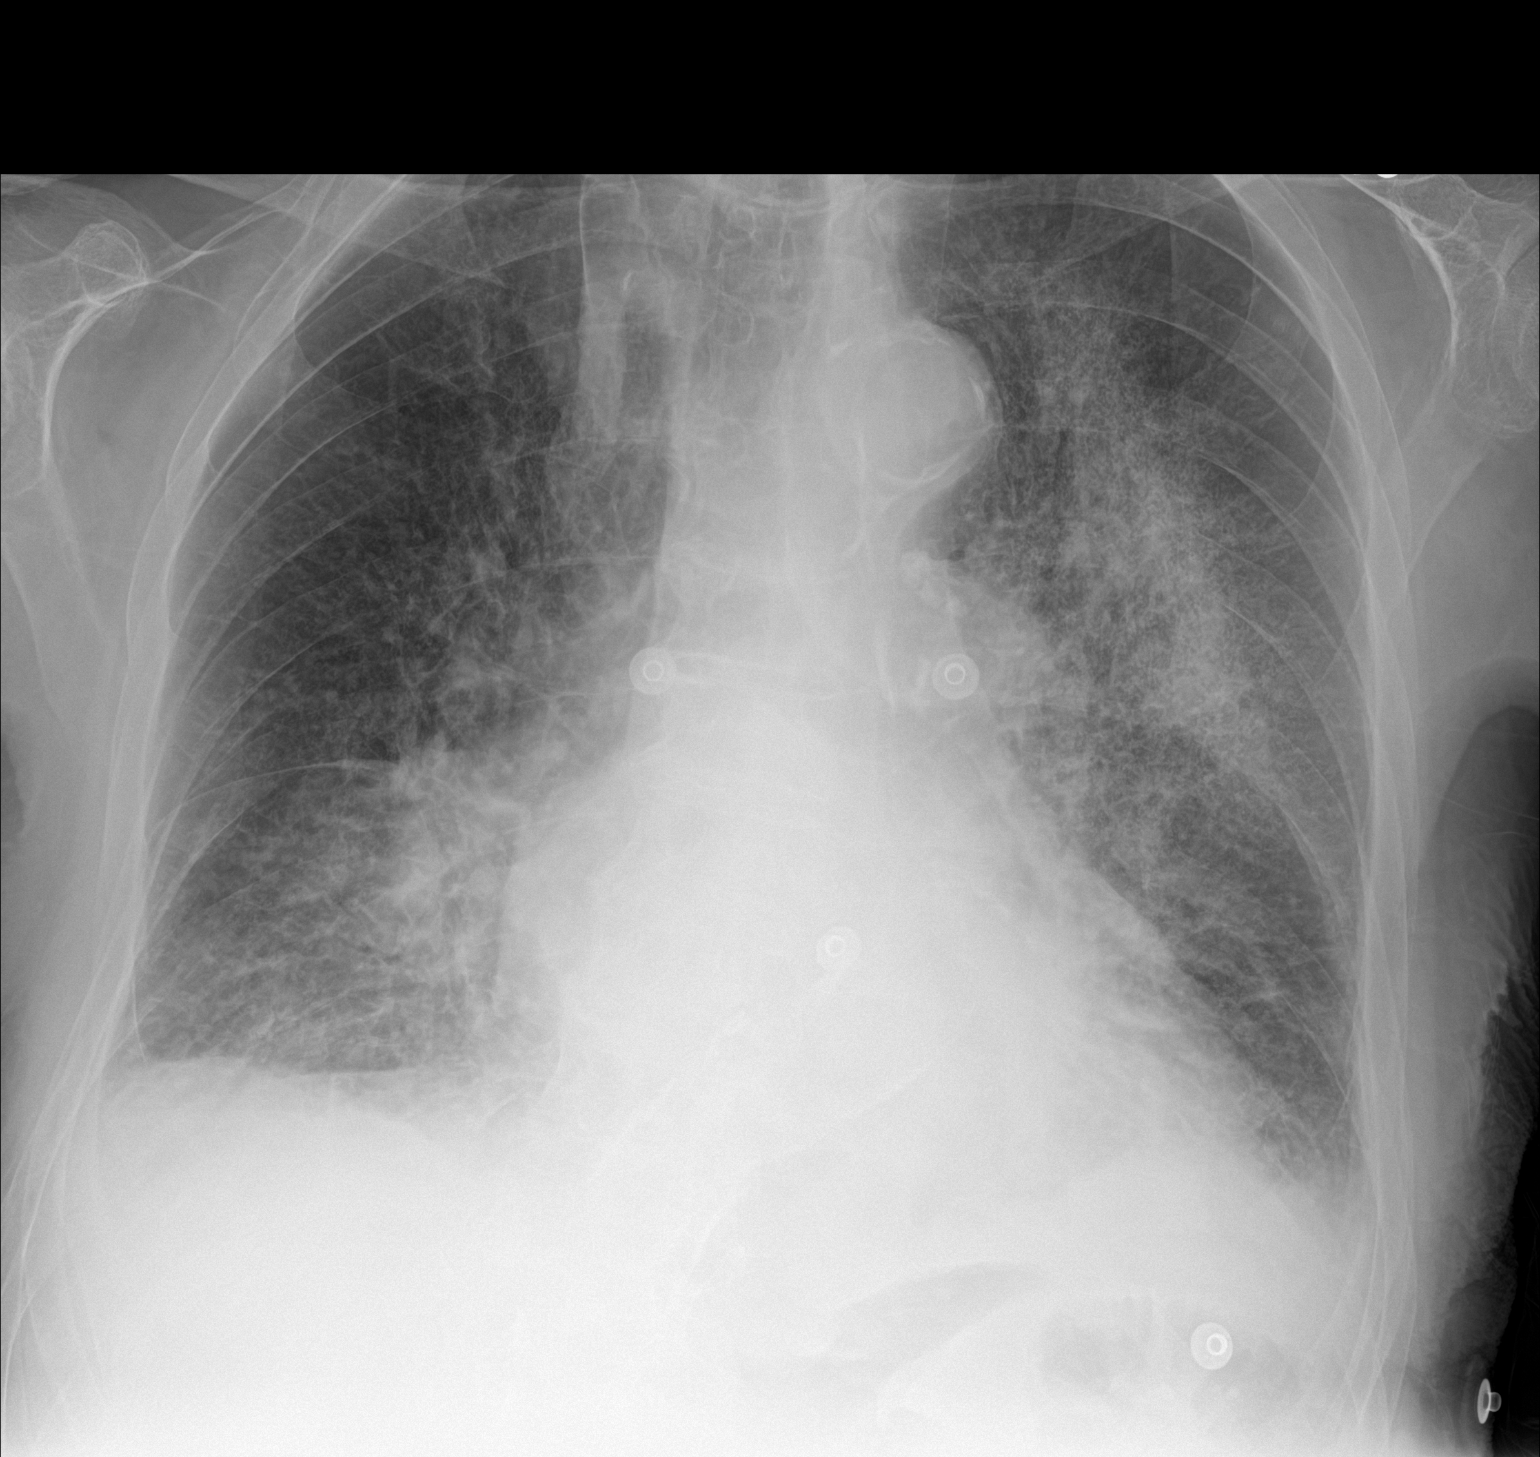

[2 of 2 positions shown; findings below may reference images not displayed]

FINDINGS: There is new cardiomegaly with diffuse bilateral pulmonary edema as
well as small bilateral pleural effusions. Tortuous calcified
thoracic aorta. Calcified granulomas in the left hilum.
IMPRESSION: Cardiomegaly with bilateral pulmonary edema and small bilateral
effusions.

## 2016-04-16 IMAGING — DX DG CHEST 1V PORT
1 series · 1 of 1 positions shown · non-contrast
Comparison: 12/03/2014

CLINICAL DATA: Congestive heart failure.

EXAM:
PORTABLE CHEST 1 VIEW

[chest ap]
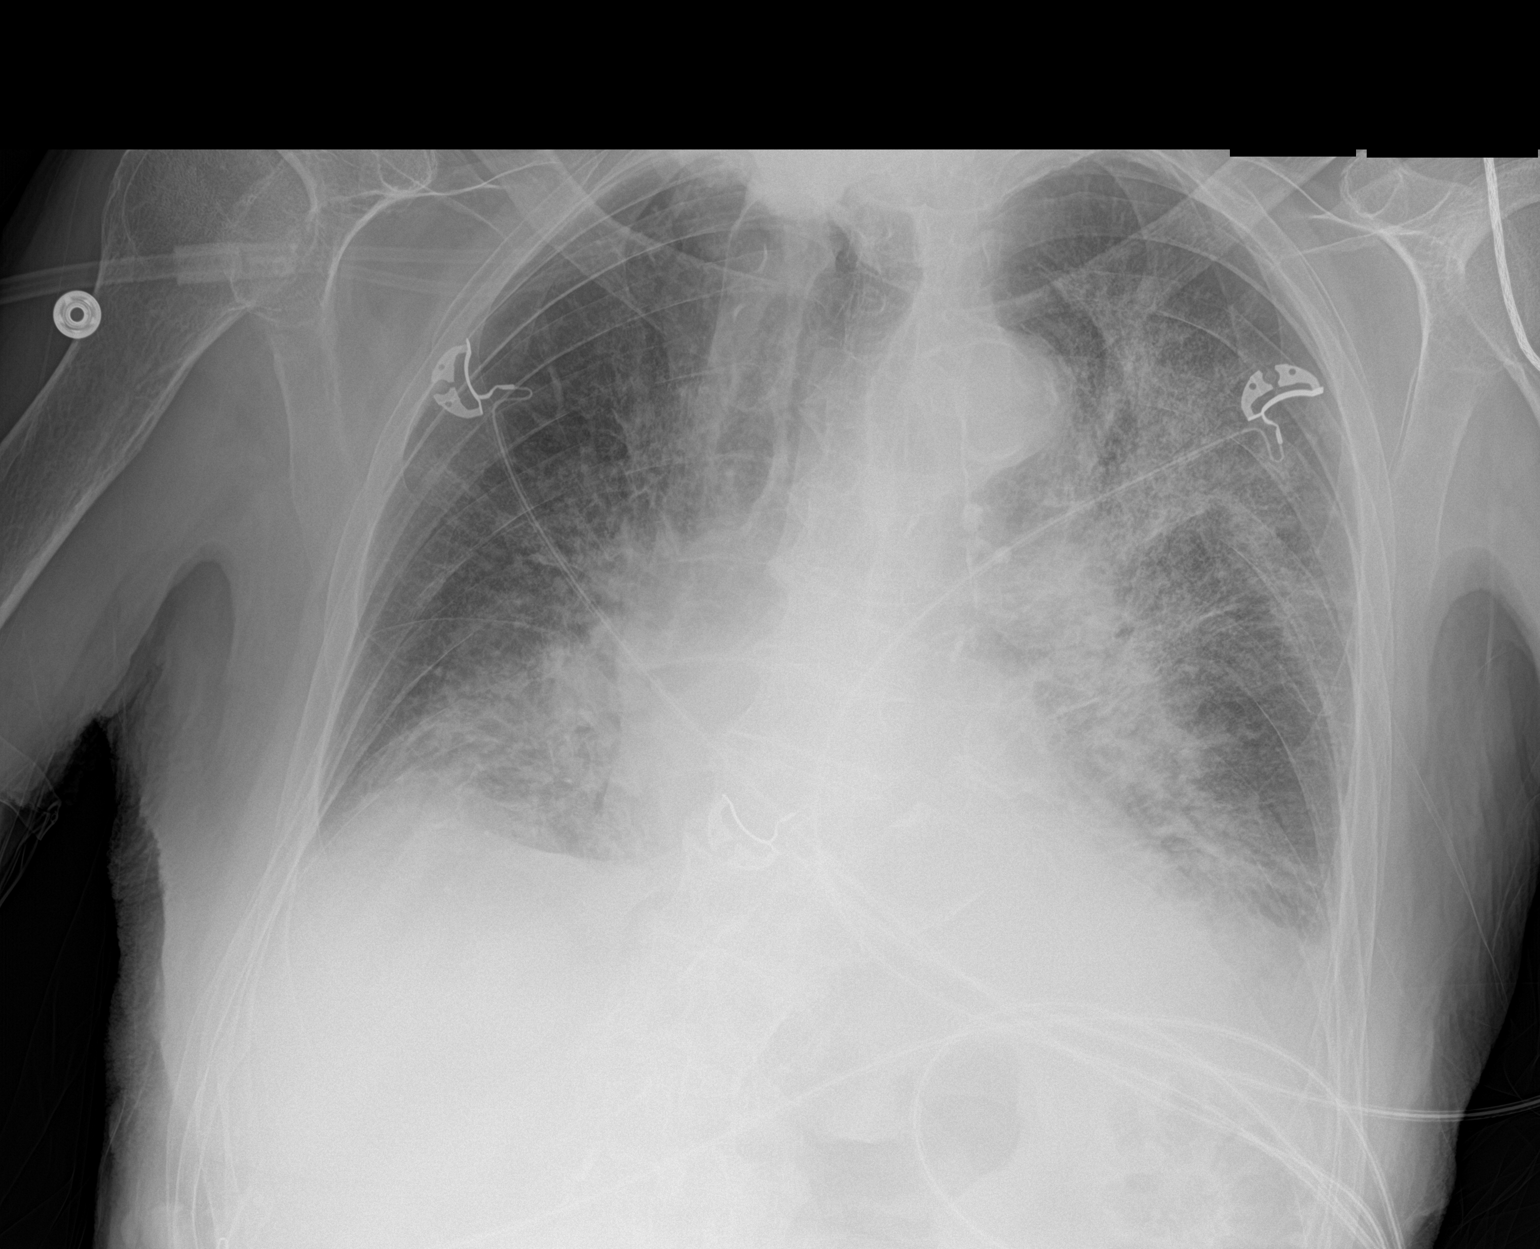

[1 of 1 positions shown; findings below may reference images not displayed]

FINDINGS: Cardiomegaly with pulmonary vascular prominence and interstitial
prominence again noted. Interstitial prominence is increased
slightly from prior exam. Small pleural effusions. Findings
consistent congestive heart failure with interim slight progression.
Calcified left hilar lymph nodes are again noted. No pneumothorax.
IMPRESSION: Progressive changes of congestive heart failure with pulmonary
interstitial edema .
